# Patient Record
Sex: Male | Born: 1935 | Race: White | Hispanic: No | Marital: Married | Smoking: Former smoker
Health system: Southern US, Community
[De-identification: ages and names within clinical notes are randomized; demographics above are authoritative.]

## PROBLEM LIST (undated history)

## (undated) DIAGNOSIS — J90 Pleural effusion, not elsewhere classified: Secondary | ICD-10-CM

## (undated) DIAGNOSIS — N400 Enlarged prostate without lower urinary tract symptoms: Secondary | ICD-10-CM

## (undated) DIAGNOSIS — E7800 Pure hypercholesterolemia, unspecified: Secondary | ICD-10-CM

## (undated) DIAGNOSIS — I4891 Unspecified atrial fibrillation: Secondary | ICD-10-CM

## (undated) DIAGNOSIS — N189 Chronic kidney disease, unspecified: Secondary | ICD-10-CM

## (undated) DIAGNOSIS — I10 Essential (primary) hypertension: Secondary | ICD-10-CM

## (undated) DIAGNOSIS — I509 Heart failure, unspecified: Secondary | ICD-10-CM

## (undated) DIAGNOSIS — I219 Acute myocardial infarction, unspecified: Secondary | ICD-10-CM

## (undated) DIAGNOSIS — E119 Type 2 diabetes mellitus without complications: Secondary | ICD-10-CM

## (undated) DIAGNOSIS — Z972 Presence of dental prosthetic device (complete) (partial): Secondary | ICD-10-CM

## (undated) HISTORY — DX: Essential (primary) hypertension: I10

## (undated) HISTORY — DX: Heart failure, unspecified: I50.9

## (undated) HISTORY — PX: TOE SURGERY: SHX1073

---

## 1984-03-28 DIAGNOSIS — I219 Acute myocardial infarction, unspecified: Secondary | ICD-10-CM

## 1984-03-28 HISTORY — PX: CARDIAC CATHETERIZATION: SHX172

## 1984-03-28 HISTORY — DX: Acute myocardial infarction, unspecified: I21.9

## 2016-02-15 ENCOUNTER — Encounter: Payer: Self-pay | Admitting: *Deleted

## 2016-02-16 NOTE — Discharge Instructions (Signed)
Cataract Surgery, Care After °Refer to this sheet in the next few weeks. These instructions provide you with information about caring for yourself after your procedure. Your health care provider may also give you more specific instructions. Your treatment has been planned according to current medical practices, but problems sometimes occur. Call your health care provider if you have any problems or questions after your procedure. °What can I expect after the procedure? °After the procedure, it is common to have: °· Itching. °· Discomfort. °· Fluid discharge. °· Sensitivity to light and to touch. °· Bruising. °Follow these instructions at home: °Eye Care  °· Check your eye every day for signs of infection. Watch for: °¨ Redness, swelling, or pain. °¨ Fluid, blood, or pus. °¨ Warmth. °¨ Bad smell. °Activity  °· Avoid strenuous activities, such as playing contact sports, for as long as told by your health care provider. °· Do not drive or operate heavy machinery until your health care provider approves. °· Do not bend or lift heavy objects . Bending increases pressure in the eye. You can walk, climb stairs, and do light household chores. °· Ask your health care provider when you can return to work. If you work in a dusty environment, you may be advised to wear protective eyewear for a period of time. °General instructions  °· Take or apply over-the-counter and prescription medicines only as told by your health care provider. This includes eye drops. °· Do not touch or rub your eyes. °· If you were given a protective shield, wear it as told by your health care provider. If you were not given a protective shield, wear sunglasses as told by your health care provider to protect your eyes. °· Keep the area around your eye clean and dry. Avoid swimming or allowing water to hit you directly in the face while showering until told by your health care provider. Keep soap and shampoo out of your eyes. °· Do not put a contact lens  into the affected eye or eyes until your health care provider approves. °· Keep all follow-up visits as told by your health care provider. This is important. °Contact a health care provider if: ° °· You have increased bruising around your eye. °· You have pain that is not helped with medicine. °· You have a fever. °· You have redness, swelling, or pain in your eye. °· You have fluid, blood, or pus coming from your incision. °· Your vision gets worse. °Get help right away if: °· You have sudden vision loss. °This information is not intended to replace advice given to you by your health care provider. Make sure you discuss any questions you have with your health care provider. °Document Released: 10/01/2004 Document Revised: 07/23/2015 Document Reviewed: 01/22/2015 °Elsevier Interactive Patient Education © 2017 [employer] ° ° ° ° °General Anesthesia, Adult, Care After °These instructions provide you with information about caring for yourself after your procedure. Your health care provider may also give you more specific instructions. Your treatment has been planned according to current medical practices, but problems sometimes occur. Call your health care provider if you have any problems or questions after your procedure. °What can I expect after the procedure? °After the procedure, it is common to have: °· Vomiting. °· A sore throat. °· Mental slowness. °It is common to feel: °· Nauseous. °· Cold or shivery. °· Sleepy. °· Tired. °· Sore or achy, even in parts of your body where you did not have surgery. °Follow these instructions at   home: °For at least 24 hours after the procedure:  °· Do not: °¨ Participate in activities where you could fall or become injured. °¨ Drive. °¨ Use heavy machinery. °¨ Drink alcohol. °¨ Take sleeping pills or medicines that cause drowsiness. °¨ Make important decisions or sign legal documents. °¨ Take care of children on your own. °· Rest. °Eating and drinking  °· If you vomit, drink  water, juice, or soup when you can drink without vomiting. °· Drink enough fluid to keep your urine clear or pale yellow. °· Make sure you have little or no nausea before eating solid foods. °· Follow the diet recommended by your health care provider. °General instructions  °· Have a responsible adult stay with you until you are awake and alert. °· Return to your normal activities as told by your health care provider. Ask your health care provider what activities are safe for you. °· Take over-the-counter and prescription medicines only as told by your health care provider. °· If you smoke, do not smoke without supervision. °· Keep all follow-up visits as told by your health care provider. This is important. °Contact a health care provider if: °· You continue to have nausea or vomiting at home, and medicines are not helpful. °· You cannot drink fluids or start eating again. °· You cannot urinate after 8-12 hours. °· You develop a skin rash. °· You have fever. °· You have increasing redness at the site of your procedure. °Get help right away if: °· You have difficulty breathing. °· You have chest pain. °· You have unexpected bleeding. °· You feel that you are having a life-threatening or urgent problem. °This information is not intended to replace advice given to you by your health care provider. Make sure you discuss any questions you have with your health care provider. °Document Released: 06/20/2000 Document Revised: 08/17/2015 Document Reviewed: 02/26/2015 °Elsevier Interactive Patient Education © 2017 [employer] ° °

## 2016-02-23 ENCOUNTER — Encounter
Admission: RE | Disposition: A | Discharge status: Home / Self Care | Payer: Self-pay | Source: Ambulatory Visit | Attending: Ophthalmology

## 2016-02-23 ENCOUNTER — Ambulatory Visit: Payer: No Typology Code available for payment source | Admitting: Anesthesiology

## 2016-02-23 ENCOUNTER — Ambulatory Visit
Admission: RE | Admit: 2016-02-23 | Discharge: 2016-02-23 | Disposition: A | Discharge status: Home / Self Care | Payer: No Typology Code available for payment source | Source: Ambulatory Visit | Attending: Ophthalmology | Admitting: Ophthalmology

## 2016-02-23 DIAGNOSIS — E1136 Type 2 diabetes mellitus with diabetic cataract: Secondary | ICD-10-CM | POA: Insufficient documentation

## 2016-02-23 DIAGNOSIS — I252 Old myocardial infarction: Secondary | ICD-10-CM | POA: Insufficient documentation

## 2016-02-23 DIAGNOSIS — Z87891 Personal history of nicotine dependence: Secondary | ICD-10-CM | POA: Insufficient documentation

## 2016-02-23 DIAGNOSIS — I2510 Atherosclerotic heart disease of native coronary artery without angina pectoris: Secondary | ICD-10-CM | POA: Insufficient documentation

## 2016-02-23 HISTORY — DX: Acute myocardial infarction, unspecified: I21.9

## 2016-02-23 HISTORY — DX: Pure hypercholesterolemia, unspecified: E78.00

## 2016-02-23 HISTORY — DX: Presence of dental prosthetic device (complete) (partial): Z97.2

## 2016-02-23 HISTORY — DX: Type 2 diabetes mellitus without complications: E11.9

## 2016-02-23 HISTORY — DX: Chronic kidney disease, unspecified: N18.9

## 2016-02-23 HISTORY — PX: CATARACT EXTRACTION W/PHACO: SHX586

## 2016-02-23 LAB — GLUCOSE, CAPILLARY: GLUCOSE-CAPILLARY: 105 mg/dL — AB (ref 65–99)

## 2016-02-23 SURGERY — PHACOEMULSIFICATION, CATARACT, WITH IOL INSERTION
Anesthesia: Monitor Anesthesia Care | Site: Eye | Laterality: Right | Wound class: Clean

## 2016-02-23 MED ORDER — SODIUM HYALURONATE 23 MG/ML IO SOLN
INTRAOCULAR | Status: DC | PRN
  Administered 2016-02-23: 0.6 mL via INTRAOCULAR

## 2016-02-23 MED ORDER — FENTANYL CITRATE (PF) 100 MCG/2ML IJ SOLN
INTRAMUSCULAR | Status: DC | PRN
  Administered 2016-02-23: 50 ug via INTRAVENOUS

## 2016-02-23 MED ORDER — MOXIFLOXACIN HCL 0.5 % OP SOLN
OPHTHALMIC | Status: DC | PRN
  Administered 2016-02-23: 1 [drp] via OPHTHALMIC

## 2016-02-23 MED ORDER — ARMC OPHTHALMIC DILATING DROPS
1.0000 | OPHTHALMIC | Status: DC | PRN
Start: 2016-02-23 — End: 2016-02-23
  Administered 2016-02-23 (×3): 1 via OPHTHALMIC

## 2016-02-23 MED ORDER — SODIUM HYALURONATE 10 MG/ML IO SOLN
INTRAOCULAR | Status: DC | PRN
  Administered 2016-02-23: 0.85 mL via INTRAOCULAR

## 2016-02-23 MED ORDER — EPINEPHRINE PF 1 MG/ML IJ SOLN
INTRAOCULAR | Status: DC | PRN
  Administered 2016-02-23: 102 mL via OPHTHALMIC

## 2016-02-23 MED ORDER — MIDAZOLAM HCL 2 MG/2ML IJ SOLN
INTRAMUSCULAR | Status: DC | PRN
  Administered 2016-02-23: 2 mg via INTRAVENOUS

## 2016-02-23 MED ORDER — BSS IO SOLN
INTRAOCULAR | Status: DC | PRN
  Administered 2016-02-23: 1 mL via OPHTHALMIC

## 2016-02-23 SURGICAL SUPPLY — 19 items
CANNULA ANT/CHMB 27GA (MISCELLANEOUS) ×3 IMPLANT
CUP MEDICINE 2OZ PLAST GRAD ST (MISCELLANEOUS) ×3 IMPLANT
DISSECTOR HYDRO NUCLEUS 50X22 (MISCELLANEOUS) ×3 IMPLANT
GLOVE BIO SURGEON STRL SZ8 (GLOVE) ×3 IMPLANT
GLOVE SURG LX 7.5 STRW (GLOVE) ×4
GLOVE SURG LX STRL 7.5 STRW (GLOVE) ×2 IMPLANT
GOWN STRL REUS W/ TWL LRG LVL3 (GOWN DISPOSABLE) ×2 IMPLANT
GOWN STRL REUS W/TWL LRG LVL3 (GOWN DISPOSABLE) ×4
LENS IOL TECNIS ITEC 19.0 (Intraocular Lens) ×3 IMPLANT
MARKER SKIN DUAL TIP RULER LAB (MISCELLANEOUS) ×3 IMPLANT
PACK CATARACT (MISCELLANEOUS) ×3 IMPLANT
PACK CATARACT BRASINGTON (MISCELLANEOUS) ×3 IMPLANT
PACK EYE AFTER SURG (MISCELLANEOUS) ×3 IMPLANT
SOL PREP PVP 2OZ (MISCELLANEOUS) ×3
SOLUTION PREP PVP 2OZ (MISCELLANEOUS) ×1 IMPLANT
SYR 3ML LL SCALE MARK (SYRINGE) ×3 IMPLANT
SYR TB 1ML LUER SLIP (SYRINGE) ×3 IMPLANT
WATER STERILE IRR 250ML POUR (IV SOLUTION) ×3 IMPLANT
WIPE NON LINTING 3.25X3.25 (MISCELLANEOUS) ×3 IMPLANT

## 2016-02-23 NOTE — H&P (Signed)
The History and Physical notes are on paper, have been signed, and are to be scanned. The patient remains stable and unchanged from the H&P.   Previous H&P reviewed, patient examined, and there are no changes.  Willey BladeBradley King 02/23/2016 9:04 AM

## 2016-02-23 NOTE — Anesthesia Postprocedure Evaluation (Signed)
Anesthesia Post Note  Patient: Andrew Watts  Procedure(s) Performed: Procedure(s) (LRB): CATARACT EXTRACTION PHACO AND INTRAOCULAR LENS PLACEMENT (IOC) (Right)  Patient location during evaluation: PACU Anesthesia Type: MAC Level of consciousness: awake and alert Pain management: pain level controlled Vital Signs Assessment: post-procedure vital signs reviewed and stable Respiratory status: spontaneous breathing, nonlabored ventilation, respiratory function stable and patient connected to nasal cannula oxygen Cardiovascular status: stable and blood pressure returned to baseline Anesthetic complications: no    Scarlette Sliceachel B Deangelo Berns

## 2016-02-23 NOTE — Op Note (Signed)
OPERATIVE NOTE  Andrew Watts [id number] 02/23/2016   PREOPERATIVE DIAGNOSIS:  Nuclear sclerotic cataract right eye.  H25.11   POSTOPERATIVE DIAGNOSIS:    Nuclear sclerotic cataract right eye.   2.  Intraoperative floppy iris syndrome.   PROCEDURE:  Phacoemusification with posterior chamber intraocular lens placement of the right eye   LENS:   Implant Name Type Inv. Item Serial No. Manufacturer Lot No. LRB No. Used  LENS IOL DIOP 19.0 - [id number] Intraocular Lens LENS IOL DIOP 19.0 [id number]   Right 1       PCB00 +19.0   ULTRASOUND TIME: 0 minutes 42 seconds.  CDE 5.56   SURGEON:  Willey BladeBradley King, MD, MPH  ANESTHESIOLOGIST: Anesthesiologist: Scarlette Sliceachel B Beach, MD CRNA: Andee PolesWendy Bush, CRNA   ANESTHESIA:  Topical with tetracaine drops augmented with 1% preservative-free intracameral lidocaine.  ESTIMATED BLOOD LOSS: less than 1 mL.   COMPLICATIONS:  None.   DESCRIPTION OF PROCEDURE:  The patient was identified in the holding room and transported to the operating room and placed in the supine position under the operating microscope.  The right eye was identified as the operative eye and it was prepped and draped in the usual sterile ophthalmic fashion.   A 1.0 millimeter clear-corneal paracentesis was made at the 10:30 position. 0.5 ml of preservative-free 1% lidocaine with epinephrine was injected into the anterior chamber.  The anterior chamber was filled with Healon 5 viscoelastic.  A 2.4 millimeter keratome was used to make a near-clear corneal incision at the 8:00 position.  A curvilinear capsulorrhexis was made with a cystotome and capsulorrhexis forceps.  Balanced salt solution was used to hydrodissect and hydrodelineate the nucleus.   Phacoemulsification was then used in stop and chop fashion to remove the lens nucleus and epinucleus.  The remaining cortex was then removed using the irrigation and aspiration handpiece. Healon was then placed into the capsular bag to distend  it for lens placement.  A lens was then injected into the capsular bag.  The remaining viscoelastic was aspirated.   Wounds were hydrated with balanced salt solution.  The anterior chamber was inflated to a physiologic pressure with balanced salt solution.   Intracameral vigamox 0.1 mL undiluted was injected into the eye and a drop placed onto the ocular surface.  No wound leaks were noted.  The patient was taken to the recovery room in stable condition without complications of anesthesia or surgery  The iris became mildly floppy and miotic during the surgery, and the patient had a prominent bells and prominent brow, but there were no complications and the case was completed smoothly without complication.  Willey BladeBradley King 02/23/2016, 10:38 AM

## 2016-02-23 NOTE — Anesthesia Preprocedure Evaluation (Signed)
Anesthesia Evaluation  Patient identified by MRN, date of birth, ID band Patient awake    Reviewed: Allergy & Precautions, H&P , NPO status , Patient's Chart, lab work & pertinent test results, reviewed documented beta blocker date and time   Airway Mallampati: II  TM Distance: >3 FB Neck ROM: full    Dental  (+) Upper Dentures, Lower Dentures   Pulmonary neg pulmonary ROS, former smoker,    Pulmonary exam normal breath sounds clear to auscultation       Cardiovascular Exercise Tolerance: Good + CAD and + Past MI   Rhythm:regular Rate:Normal     Neuro/Psych negative neurological ROS  negative psych ROS   GI/Hepatic negative GI ROS, Neg liver ROS,   Endo/Other  diabetes  Renal/GU CRFRenal disease  negative genitourinary   Musculoskeletal   Abdominal   Peds  Hematology negative hematology ROS (+)   Anesthesia Other Findings   Reproductive/Obstetrics negative OB ROS                             Anesthesia Physical Anesthesia Plan  ASA: III  Anesthesia Plan: MAC   Post-op Pain Management:    Induction:   Airway Management Planned:   Additional Equipment:   Intra-op Plan:   Post-operative Plan:   Informed Consent: I have reviewed the patients History and Physical, chart, labs and discussed the procedure including the risks, benefits and alternatives for the proposed anesthesia with the patient or authorized representative who has indicated his/her understanding and acceptance.   Dental Advisory Given  Plan Discussed with: CRNA  Anesthesia Plan Comments:         Anesthesia Quick Evaluation

## 2016-02-23 NOTE — Anesthesia Procedure Notes (Signed)
Procedure Name: MAC Performed by: Sundee Garland Pre-anesthesia Checklist: Patient identified, Emergency Drugs available, Suction available, Timeout performed and Patient being monitored Patient Re-evaluated:Patient Re-evaluated prior to inductionOxygen Delivery Method: Nasal cannula Placement Confirmation: positive ETCO2     

## 2016-02-23 NOTE — Transfer of Care (Signed)
Immediate Anesthesia Transfer of Care Note  Patient: Andrew Watts  Procedure(s) Performed: Procedure(s) with comments: CATARACT EXTRACTION PHACO AND INTRAOCULAR LENS PLACEMENT (IOC) (Right) - RIGHT DIABETIC - oral meds  Patient Location: PACU  Anesthesia Type: MAC  Level of Consciousness: awake, alert  and patient cooperative  Airway and Oxygen Therapy: Patient Spontanous Breathing and Patient connected to supplemental oxygen  Post-op Assessment: Post-op Vital signs reviewed, Patient's Cardiovascular Status Stable, Respiratory Function Stable, Patent Airway and No signs of Nausea or vomiting  Post-op Vital Signs: Reviewed and stable  Complications: No apparent anesthesia complications

## 2016-02-24 ENCOUNTER — Encounter: Payer: Self-pay | Admitting: Ophthalmology

## 2016-03-01 ENCOUNTER — Encounter: Payer: Self-pay | Admitting: Ophthalmology

## 2016-03-29 ENCOUNTER — Encounter: Payer: Self-pay | Admitting: *Deleted

## 2016-03-29 NOTE — Discharge Instructions (Signed)
Cataract Surgery, Care After °Refer to this sheet in the next few weeks. These instructions provide you with information about caring for yourself after your procedure. Your health care provider may also give you more specific instructions. Your treatment has been planned according to current medical practices, but problems sometimes occur. Call your health care provider if you have any problems or questions after your procedure. °What can I expect after the procedure? °After the procedure, it is common to have: °· Itching. °· Discomfort. °· Fluid discharge. °· Sensitivity to light and to touch. °· Bruising. °Follow these instructions at home: °Eye Care  °· Check your eye every day for signs of infection. Watch for: °¨ Redness, swelling, or pain. °¨ Fluid, blood, or pus. °¨ Warmth. °¨ Bad smell. °Activity  °· Avoid strenuous activities, such as playing contact sports, for as long as told by your health care provider. °· Do not drive or operate heavy machinery until your health care provider approves. °· Do not bend or lift heavy objects . Bending increases pressure in the eye. You can walk, climb stairs, and do light household chores. °· Ask your health care provider when you can return to work. If you work in a dusty environment, you may be advised to wear protective eyewear for a period of time. °General instructions  °· Take or apply over-the-counter and prescription medicines only as told by your health care provider. This includes eye drops. °· Do not touch or rub your eyes. °· If you were given a protective shield, wear it as told by your health care provider. If you were not given a protective shield, wear sunglasses as told by your health care provider to protect your eyes. °· Keep the area around your eye clean and dry. Avoid swimming or allowing water to hit you directly in the face while showering until told by your health care provider. Keep soap and shampoo out of your eyes. °· Do not put a contact lens  into the affected eye or eyes until your health care provider approves. °· Keep all follow-up visits as told by your health care provider. This is important. °Contact a health care provider if: ° °· You have increased bruising around your eye. °· You have pain that is not helped with medicine. °· You have a fever. °· You have redness, swelling, or pain in your eye. °· You have fluid, blood, or pus coming from your incision. °· Your vision gets worse. °Get help right away if: °· You have sudden vision loss. °This information is not intended to replace advice given to you by your health care provider. Make sure you discuss any questions you have with your health care provider. °Document Released: 10/01/2004 Document Revised: 07/23/2015 Document Reviewed: 01/22/2015 °Elsevier Interactive Patient Education © 2017 [employer] ° ° ° ° °General Anesthesia, Adult, Care After °These instructions provide you with information about caring for yourself after your procedure. Your health care provider may also give you more specific instructions. Your treatment has been planned according to current medical practices, but problems sometimes occur. Call your health care provider if you have any problems or questions after your procedure. °What can I expect after the procedure? °After the procedure, it is common to have: °· Vomiting. °· A sore throat. °· Mental slowness. °It is common to feel: °· Nauseous. °· Cold or shivery. °· Sleepy. °· Tired. °· Sore or achy, even in parts of your body where you did not have surgery. °Follow these instructions at   home: °For at least 24 hours after the procedure:  °· Do not: °¨ Participate in activities where you could fall or become injured. °¨ Drive. °¨ Use heavy machinery. °¨ Drink alcohol. °¨ Take sleeping pills or medicines that cause drowsiness. °¨ Make important decisions or sign legal documents. °¨ Take care of children on your own. °· Rest. °Eating and drinking  °· If you vomit, drink  water, juice, or soup when you can drink without vomiting. °· Drink enough fluid to keep your urine clear or pale yellow. °· Make sure you have little or no nausea before eating solid foods. °· Follow the diet recommended by your health care provider. °General instructions  °· Have a responsible adult stay with you until you are awake and alert. °· Return to your normal activities as told by your health care provider. Ask your health care provider what activities are safe for you. °· Take over-the-counter and prescription medicines only as told by your health care provider. °· If you smoke, do not smoke without supervision. °· Keep all follow-up visits as told by your health care provider. This is important. °Contact a health care provider if: °· You continue to have nausea or vomiting at home, and medicines are not helpful. °· You cannot drink fluids or start eating again. °· You cannot urinate after 8-12 hours. °· You develop a skin rash. °· You have fever. °· You have increasing redness at the site of your procedure. °Get help right away if: °· You have difficulty breathing. °· You have chest pain. °· You have unexpected bleeding. °· You feel that you are having a life-threatening or urgent problem. °This information is not intended to replace advice given to you by your health care provider. Make sure you discuss any questions you have with your health care provider. °Document Released: 06/20/2000 Document Revised: 08/17/2015 Document Reviewed: 02/26/2015 °Elsevier Interactive Patient Education © 2017 [employer] ° °

## 2016-04-05 ENCOUNTER — Ambulatory Visit: Payer: Medicare Other | Admitting: Anesthesiology

## 2016-04-05 ENCOUNTER — Encounter
Admission: RE | Disposition: A | Discharge status: Home / Self Care | Payer: Self-pay | Source: Ambulatory Visit | Attending: Ophthalmology

## 2016-04-05 ENCOUNTER — Ambulatory Visit
Admission: RE | Admit: 2016-04-05 | Discharge: 2016-04-05 | Disposition: A | Discharge status: Home / Self Care | Payer: Medicare Other | Source: Ambulatory Visit | Attending: Ophthalmology | Admitting: Ophthalmology

## 2016-04-05 DIAGNOSIS — Z79899 Other long term (current) drug therapy: Secondary | ICD-10-CM | POA: Diagnosis not present

## 2016-04-05 DIAGNOSIS — Z7982 Long term (current) use of aspirin: Secondary | ICD-10-CM | POA: Diagnosis not present

## 2016-04-05 DIAGNOSIS — Z87891 Personal history of nicotine dependence: Secondary | ICD-10-CM | POA: Insufficient documentation

## 2016-04-05 DIAGNOSIS — Z7984 Long term (current) use of oral hypoglycemic drugs: Secondary | ICD-10-CM | POA: Diagnosis not present

## 2016-04-05 DIAGNOSIS — N189 Chronic kidney disease, unspecified: Secondary | ICD-10-CM | POA: Insufficient documentation

## 2016-04-05 DIAGNOSIS — I252 Old myocardial infarction: Secondary | ICD-10-CM | POA: Diagnosis not present

## 2016-04-05 DIAGNOSIS — Z955 Presence of coronary angioplasty implant and graft: Secondary | ICD-10-CM | POA: Diagnosis not present

## 2016-04-05 DIAGNOSIS — E7800 Pure hypercholesterolemia, unspecified: Secondary | ICD-10-CM | POA: Diagnosis not present

## 2016-04-05 DIAGNOSIS — E1122 Type 2 diabetes mellitus with diabetic chronic kidney disease: Secondary | ICD-10-CM | POA: Diagnosis not present

## 2016-04-05 DIAGNOSIS — H2512 Age-related nuclear cataract, left eye: Secondary | ICD-10-CM | POA: Diagnosis present

## 2016-04-05 DIAGNOSIS — I129 Hypertensive chronic kidney disease with stage 1 through stage 4 chronic kidney disease, or unspecified chronic kidney disease: Secondary | ICD-10-CM | POA: Insufficient documentation

## 2016-04-05 HISTORY — PX: CATARACT EXTRACTION W/PHACO: SHX586

## 2016-04-05 LAB — GLUCOSE, CAPILLARY
GLUCOSE-CAPILLARY: 109 mg/dL — AB (ref 65–99)
Glucose-Capillary: 105 mg/dL — ABNORMAL HIGH (ref 65–99)

## 2016-04-05 SURGERY — PHACOEMULSIFICATION, CATARACT, WITH IOL INSERTION
Anesthesia: Monitor Anesthesia Care | Laterality: Left | Wound class: Clean

## 2016-04-05 MED ORDER — FENTANYL CITRATE (PF) 100 MCG/2ML IJ SOLN
INTRAMUSCULAR | Status: DC | PRN
  Administered 2016-04-05: 100 ug via INTRAVENOUS

## 2016-04-05 MED ORDER — ACETAMINOPHEN 160 MG/5ML PO SOLN: 325.0000 mg | ORAL | Status: DC | PRN

## 2016-04-05 MED ORDER — MIDAZOLAM HCL 2 MG/2ML IJ SOLN
INTRAMUSCULAR | Status: DC | PRN
  Administered 2016-04-05: 2 mg via INTRAVENOUS

## 2016-04-05 MED ORDER — ACETAMINOPHEN 325 MG PO TABS: 325.0000 mg | ORAL_TABLET | ORAL | Status: DC | PRN

## 2016-04-05 MED ORDER — SODIUM HYALURONATE 10 MG/ML IO SOLN
INTRAOCULAR | Status: DC | PRN
  Administered 2016-04-05: .55 mL via INTRAOCULAR

## 2016-04-05 MED ORDER — ARMC OPHTHALMIC DILATING DROPS
1.0000 "application " | OPHTHALMIC | Status: DC | PRN
  Administered 2016-04-05 (×3): 1 via OPHTHALMIC

## 2016-04-05 MED ORDER — LACTATED RINGERS IV SOLN: 500.0000 mL | INTRAVENOUS | Status: DC

## 2016-04-05 MED ORDER — SODIUM HYALURONATE 23 MG/ML IO SOLN
INTRAOCULAR | Status: DC | PRN
  Administered 2016-04-05: 0.6 mL via INTRAOCULAR

## 2016-04-05 MED ORDER — MOXIFLOXACIN HCL 0.5 % OP SOLN
OPHTHALMIC | Status: DC | PRN
  Administered 2016-04-05: 15 [drp] via OPHTHALMIC

## 2016-04-05 MED ORDER — LIDOCAINE HCL (PF) 4 % IJ SOLN
INTRAMUSCULAR | Status: DC | PRN
  Administered 2016-04-05: 1 mL via OPHTHALMIC

## 2016-04-05 MED ORDER — LACTATED RINGERS IV SOLN: INTRAVENOUS | Status: DC

## 2016-04-05 SURGICAL SUPPLY — 19 items
CANNULA ANT/CHMB 27GA (MISCELLANEOUS) ×3 IMPLANT
CUP MEDICINE 2OZ PLAST GRAD ST (MISCELLANEOUS) ×3 IMPLANT
DISSECTOR HYDRO NUCLEUS 50X22 (MISCELLANEOUS) ×3 IMPLANT
GLOVE BIO SURGEON STRL SZ8 (GLOVE) ×3 IMPLANT
GLOVE SURG LX 7.5 STRW (GLOVE) ×2
GLOVE SURG LX STRL 7.5 STRW (GLOVE) ×1 IMPLANT
GOWN STRL REUS W/ TWL LRG LVL3 (GOWN DISPOSABLE) ×2 IMPLANT
GOWN STRL REUS W/TWL LRG LVL3 (GOWN DISPOSABLE) ×4
LENS IOL TECNIS ITEC 20.5 (Intraocular Lens) ×3 IMPLANT
MARKER SKIN DUAL TIP RULER LAB (MISCELLANEOUS) ×3 IMPLANT
PACK CATARACT (MISCELLANEOUS) ×3 IMPLANT
PACK CATARACT BRASINGTON (MISCELLANEOUS) ×3 IMPLANT
PACK EYE AFTER SURG (MISCELLANEOUS) ×3 IMPLANT
SOL PREP PVP 2OZ (MISCELLANEOUS) ×3
SOLUTION PREP PVP 2OZ (MISCELLANEOUS) ×1 IMPLANT
SYR 3ML LL SCALE MARK (SYRINGE) ×3 IMPLANT
SYR TB 1ML LUER SLIP (SYRINGE) ×3 IMPLANT
WATER STERILE IRR 250ML POUR (IV SOLUTION) ×3 IMPLANT
WIPE NON LINTING 3.25X3.25 (MISCELLANEOUS) ×3 IMPLANT

## 2016-04-05 NOTE — Transfer of Care (Signed)
Immediate Anesthesia Transfer of Care Note  Patient: Andrew Watts  Procedure(s) Performed: Procedure(s) with comments: CATARACT EXTRACTION PHACO AND INTRAOCULAR LENS PLACEMENT (IOC) (Left) - LEFT DIABETES - oral meds IVA TOPICAL  Patient Location: PACU  Anesthesia Type: MAC  Level of Consciousness: awake, alert  and patient cooperative  Airway and Oxygen Therapy: Patient Spontanous Breathing and Patient connected to supplemental oxygen  Post-op Assessment: Post-op Vital signs reviewed, Patient's Cardiovascular Status Stable, Respiratory Function Stable, Patent Airway and No signs of Nausea or vomiting  Post-op Vital Signs: Reviewed and stable  Complications: No apparent anesthesia complications

## 2016-04-05 NOTE — H&P (Signed)
The History and Physical notes are on paper, have been signed, and are to be scanned. The patient remains stable and unchanged from the H&P.   Previous H&P reviewed, patient examined, and there are no changes.  Willey BladeBradley Rhen Kawecki 04/05/2016 9:23 AM

## 2016-04-05 NOTE — Op Note (Signed)
OPERATIVE NOTE  Nadene Rubinsrchie Gassmann [id number] 04/05/2016   PREOPERATIVE DIAGNOSIS:  Nuclear sclerotic cataract left eye.  H25.12   POSTOPERATIVE DIAGNOSIS:    Nuclear sclerotic cataract left eye.     PROCEDURE:  Phacoemusification with posterior chamber intraocular lens placement of the left eye   LENS:   Implant Name Type Inv. Item Serial No. Manufacturer Lot No. LRB No. Used  LENS IOL DIOP 20.5 - [id number] Intraocular Lens LENS IOL DIOP 20.5 [id number]   Left 1       PCB00+20.5   ULTRASOUND TIME: 0 minutes 55 seconds.  CDE 11.00   SURGEON:  Willey BladeBradley King, MD, MPH   ANESTHESIA:  Topical with tetracaine drops augmented with 1% preservative-free intracameral lidocaine.   COMPLICATIONS:  None.   DESCRIPTION OF PROCEDURE:  The patient was identified in the holding room and transported to the operating room and placed in the supine position under the operating microscope.  The left eye was identified as the operative eye and it was prepped and draped in the usual sterile ophthalmic fashion.   A 1.0 millimeter clear-corneal paracentesis was made at the 5:00 position. 0.5 ml of preservative-free 1% lidocaine with epinephrine was injected into the anterior chamber.  The anterior chamber was filled with Healon 5 viscoelastic.  A 2.4 millimeter keratome was used to make a near-clear corneal incision at the 2:00 position.  A curvilinear capsulorrhexis was made with a cystotome and capsulorrhexis forceps.  Balanced salt solution was used to hydrodissect and hydrodelineate the nucleus.  The lens prolapsed forward during the hydrodissection and an anterior capsular tear was noted at 12:00.   Phacoemulsification was then used in stop and chop fashion to remove the lens nucleus and epinucleus.  The remaining cortex was then removed using the irrigation and aspiration handpiece. Healon was then placed into the capsular bag to distend it for lens placement.  A lens was then injected into the capsular  bag.  The haptics were oriented at 9:00 and 3:00--away from the anterior capsular tear.  The remaining viscoelastic was aspirated.  The lens was well centered and the posterior capsule was intact.   Wounds were hydrated with balanced salt solution.  The anterior chamber was inflated to a physiologic pressure with balanced salt solution.  Intracameral vigamox 0.1 mL undiltued was injected into the eye and a drop placed onto the ocular surface.  No wound leaks were noted.  The patient was taken to the recovery room in stable condition without complications of anesthesia or surgery  Willey BladeBradley King 04/05/2016, 10:03 AM

## 2016-04-05 NOTE — Anesthesia Procedure Notes (Signed)
Procedure Name: MAC Date/Time: 04/05/2016 9:35 AM Performed by: Janna Arch Pre-anesthesia Checklist: Patient identified, Emergency Drugs available, Suction available and Patient being monitored Patient Re-evaluated:Patient Re-evaluated prior to inductionOxygen Delivery Method: Nasal cannula

## 2016-04-05 NOTE — Anesthesia Preprocedure Evaluation (Signed)
Anesthesia Evaluation  Patient identified by MRN, date of birth, ID band Patient awake    Reviewed: Allergy & Precautions, H&P , NPO status , Patient's Chart, lab work & pertinent test results, reviewed documented beta blocker date and time   Airway Mallampati: II  TM Distance: >3 FB Neck ROM: full    Dental no notable dental hx.    Pulmonary former smoker,    Pulmonary exam normal breath sounds clear to auscultation       Cardiovascular Exercise Tolerance: Good + Past MI   Rhythm:regular Rate:Normal     Neuro/Psych negative neurological ROS  negative psych ROS   GI/Hepatic negative GI ROS, Neg liver ROS,   Endo/Other  diabetes, Type 2  Renal/GU CRF  negative genitourinary   Musculoskeletal   Abdominal   Peds  Hematology negative hematology ROS (+)   Anesthesia Other Findings   Reproductive/Obstetrics negative OB ROS                             Anesthesia Physical Anesthesia Plan  ASA: III  Anesthesia Plan: MAC   Post-op Pain Management:    Induction:   Airway Management Planned:   Additional Equipment:   Intra-op Plan:   Post-operative Plan:   Informed Consent: I have reviewed the patients History and Physical, chart, labs and discussed the procedure including the risks, benefits and alternatives for the proposed anesthesia with the patient or authorized representative who has indicated his/her understanding and acceptance.     Plan Discussed with: CRNA  Anesthesia Plan Comments:         Anesthesia Quick Evaluation

## 2016-04-05 NOTE — Anesthesia Postprocedure Evaluation (Signed)
Anesthesia Post Note  Patient: Andrew Watts  Procedure(s) Performed: Procedure(s) (LRB): CATARACT EXTRACTION PHACO AND INTRAOCULAR LENS PLACEMENT (IOC) (Left)  Patient location during evaluation: PACU Anesthesia Type: MAC Level of consciousness: awake and alert Pain management: pain level controlled Vital Signs Assessment: post-procedure vital signs reviewed and stable Respiratory status: spontaneous breathing, nonlabored ventilation and respiratory function stable Cardiovascular status: stable and blood pressure returned to baseline Anesthetic complications: no    Bess Saltzman D Alston Berrie

## 2016-04-06 ENCOUNTER — Encounter: Payer: Self-pay | Admitting: Ophthalmology

## 2019-05-14 ENCOUNTER — Emergency Department: Payer: Medicare Other

## 2019-05-14 ENCOUNTER — Inpatient Hospital Stay
Admission: EM | Admit: 2019-05-14 | Discharge: 2019-05-17 | DRG: 291 | Disposition: A | Discharge status: Home / Self Care | Payer: Medicare Other | Attending: Internal Medicine | Admitting: Internal Medicine

## 2019-05-14 ENCOUNTER — Encounter: Payer: Self-pay | Admitting: Emergency Medicine

## 2019-05-14 ENCOUNTER — Other Ambulatory Visit: Payer: Self-pay

## 2019-05-14 DIAGNOSIS — Z87891 Personal history of nicotine dependence: Secondary | ICD-10-CM

## 2019-05-14 DIAGNOSIS — Z79899 Other long term (current) drug therapy: Secondary | ICD-10-CM

## 2019-05-14 DIAGNOSIS — J90 Pleural effusion, not elsewhere classified: Secondary | ICD-10-CM | POA: Diagnosis not present

## 2019-05-14 DIAGNOSIS — N1832 Chronic kidney disease, stage 3b: Secondary | ICD-10-CM

## 2019-05-14 DIAGNOSIS — I5031 Acute diastolic (congestive) heart failure: Secondary | ICD-10-CM

## 2019-05-14 DIAGNOSIS — I252 Old myocardial infarction: Secondary | ICD-10-CM | POA: Diagnosis not present

## 2019-05-14 DIAGNOSIS — Z7982 Long term (current) use of aspirin: Secondary | ICD-10-CM | POA: Diagnosis not present

## 2019-05-14 DIAGNOSIS — Z9889 Other specified postprocedural states: Secondary | ICD-10-CM

## 2019-05-14 DIAGNOSIS — Z833 Family history of diabetes mellitus: Secondary | ICD-10-CM

## 2019-05-14 DIAGNOSIS — E119 Type 2 diabetes mellitus without complications: Secondary | ICD-10-CM

## 2019-05-14 DIAGNOSIS — E7800 Pure hypercholesterolemia, unspecified: Secondary | ICD-10-CM | POA: Diagnosis present

## 2019-05-14 DIAGNOSIS — E1169 Type 2 diabetes mellitus with other specified complication: Secondary | ICD-10-CM

## 2019-05-14 DIAGNOSIS — I509 Heart failure, unspecified: Secondary | ICD-10-CM

## 2019-05-14 DIAGNOSIS — D696 Thrombocytopenia, unspecified: Secondary | ICD-10-CM | POA: Diagnosis present

## 2019-05-14 DIAGNOSIS — I130 Hypertensive heart and chronic kidney disease with heart failure and stage 1 through stage 4 chronic kidney disease, or unspecified chronic kidney disease: Principal | ICD-10-CM | POA: Diagnosis present

## 2019-05-14 DIAGNOSIS — I429 Cardiomyopathy, unspecified: Secondary | ICD-10-CM | POA: Diagnosis present

## 2019-05-14 DIAGNOSIS — I10 Essential (primary) hypertension: Secondary | ICD-10-CM

## 2019-05-14 DIAGNOSIS — I5021 Acute systolic (congestive) heart failure: Secondary | ICD-10-CM

## 2019-05-14 DIAGNOSIS — J918 Pleural effusion in other conditions classified elsewhere: Secondary | ICD-10-CM | POA: Diagnosis present

## 2019-05-14 DIAGNOSIS — I959 Hypotension, unspecified: Secondary | ICD-10-CM | POA: Diagnosis not present

## 2019-05-14 DIAGNOSIS — Z20822 Contact with and (suspected) exposure to covid-19: Secondary | ICD-10-CM | POA: Diagnosis present

## 2019-05-14 DIAGNOSIS — I4891 Unspecified atrial fibrillation: Secondary | ICD-10-CM | POA: Diagnosis present

## 2019-05-14 DIAGNOSIS — E1122 Type 2 diabetes mellitus with diabetic chronic kidney disease: Secondary | ICD-10-CM

## 2019-05-14 DIAGNOSIS — R0602 Shortness of breath: Secondary | ICD-10-CM

## 2019-05-14 DIAGNOSIS — N400 Enlarged prostate without lower urinary tract symptoms: Secondary | ICD-10-CM

## 2019-05-14 DIAGNOSIS — E785 Hyperlipidemia, unspecified: Secondary | ICD-10-CM | POA: Diagnosis present

## 2019-05-14 DIAGNOSIS — N183 Chronic kidney disease, stage 3 unspecified: Secondary | ICD-10-CM

## 2019-05-14 DIAGNOSIS — I2510 Atherosclerotic heart disease of native coronary artery without angina pectoris: Secondary | ICD-10-CM | POA: Diagnosis present

## 2019-05-14 LAB — CBC WITH DIFFERENTIAL/PLATELET
Abs Immature Granulocytes: 0.04 10*3/uL (ref 0.00–0.07)
Basophils Absolute: 0.1 10*3/uL (ref 0.0–0.1)
Basophils Relative: 1 %
Eosinophils Absolute: 0.1 10*3/uL (ref 0.0–0.5)
Eosinophils Relative: 1 %
HCT: 35.6 % — ABNORMAL LOW (ref 39.0–52.0)
Hemoglobin: 11.7 g/dL — ABNORMAL LOW (ref 13.0–17.0)
Immature Granulocytes: 0 %
Lymphocytes Relative: 14 %
Lymphs Abs: 1.3 10*3/uL (ref 0.7–4.0)
MCH: 30.2 pg (ref 26.0–34.0)
MCHC: 32.9 g/dL (ref 30.0–36.0)
MCV: 91.8 fL (ref 80.0–100.0)
Monocytes Absolute: 0.9 10*3/uL (ref 0.1–1.0)
Monocytes Relative: 9 %
Neutro Abs: 6.7 10*3/uL (ref 1.7–7.7)
Neutrophils Relative %: 75 %
Platelets: 122 10*3/uL — ABNORMAL LOW (ref 150–400)
RBC: 3.88 MIL/uL — ABNORMAL LOW (ref 4.22–5.81)
RDW: 12.4 % (ref 11.5–15.5)
WBC: 9 10*3/uL (ref 4.0–10.5)
nRBC: 0 % (ref 0.0–0.2)

## 2019-05-14 LAB — COMPREHENSIVE METABOLIC PANEL
ALT: 16 U/L (ref 0–44)
AST: 13 U/L — ABNORMAL LOW (ref 15–41)
Albumin: 3.8 g/dL (ref 3.5–5.0)
Alkaline Phosphatase: 69 U/L (ref 38–126)
Anion gap: 9 (ref 5–15)
BUN: 48 mg/dL — ABNORMAL HIGH (ref 8–23)
CO2: 20 mmol/L — ABNORMAL LOW (ref 22–32)
Calcium: 9.2 mg/dL (ref 8.9–10.3)
Chloride: 107 mmol/L (ref 98–111)
Creatinine, Ser: 1.78 mg/dL — ABNORMAL HIGH (ref 0.61–1.24)
GFR calc Af Amer: 40 mL/min — ABNORMAL LOW (ref >60)
GFR calc non Af Amer: 35 mL/min — ABNORMAL LOW (ref >60)
Glucose, Bld: 288 mg/dL — ABNORMAL HIGH (ref 70–99)
Potassium: 4.2 mmol/L (ref 3.5–5.1)
Sodium: 136 mmol/L (ref 135–145)
Total Bilirubin: 0.8 mg/dL (ref 0.3–1.2)
Total Protein: 7.2 g/dL (ref 6.5–8.1)

## 2019-05-14 LAB — GLUCOSE, CAPILLARY: Glucose-Capillary: 219 mg/dL — ABNORMAL HIGH (ref 70–99)

## 2019-05-14 LAB — TROPONIN I (HIGH SENSITIVITY)
Troponin I (High Sensitivity): 27 ng/L — ABNORMAL HIGH (ref <18)
Troponin I (High Sensitivity): 22 ng/L — ABNORMAL HIGH (ref <18)

## 2019-05-14 LAB — POC SARS CORONAVIRUS 2 AG: SARS Coronavirus 2 Ag: NEGATIVE

## 2019-05-14 LAB — TSH: TSH: 0.636 u[IU]/mL (ref 0.350–4.500)

## 2019-05-14 LAB — BRAIN NATRIURETIC PEPTIDE: B Natriuretic Peptide: 1017 pg/mL — ABNORMAL HIGH (ref 0.0–100.0)

## 2019-05-14 LAB — MAGNESIUM: Magnesium: 1.9 mg/dL (ref 1.7–2.4)

## 2019-05-14 MED ORDER — ZOLPIDEM TARTRATE 5 MG PO TABS: 5.0000 mg | ORAL_TABLET | Freq: Every evening | ORAL | Status: DC | PRN

## 2019-05-14 MED ORDER — ACETAMINOPHEN 325 MG PO TABS: 650.0000 mg | ORAL_TABLET | ORAL | Status: DC | PRN

## 2019-05-14 MED ORDER — ONDANSETRON HCL 4 MG/2ML IJ SOLN
4.0000 mg | Freq: Four times a day (QID) | INTRAMUSCULAR | Status: DC | PRN
  Administered 2019-05-15: 4 mg via INTRAVENOUS
  Filled 2019-05-14: qty 2

## 2019-05-14 MED ORDER — SIMVASTATIN 20 MG PO TABS
20.0000 mg | ORAL_TABLET | Freq: Every day | ORAL | Status: DC
  Administered 2019-05-14 – 2019-05-17 (×4): 20 mg via ORAL
  Filled 2019-05-14 (×4): qty 1

## 2019-05-14 MED ORDER — INSULIN ASPART 100 UNIT/ML ~~LOC~~ SOLN
0.0000 [IU] | Freq: Three times a day (TID) | SUBCUTANEOUS | Status: DC
  Administered 2019-05-14: 22:00:00 3 [IU] via SUBCUTANEOUS
  Administered 2019-05-15 (×2): 5 [IU] via SUBCUTANEOUS
  Administered 2019-05-15: 2 [IU] via SUBCUTANEOUS
  Administered 2019-05-15: 22:00:00 5 [IU] via SUBCUTANEOUS
  Administered 2019-05-16: 20:00:00 2 [IU] via SUBCUTANEOUS
  Administered 2019-05-16: 13:00:00 3 [IU] via SUBCUTANEOUS
  Administered 2019-05-16: 08:00:00 2 [IU] via SUBCUTANEOUS
  Administered 2019-05-16: 17:00:00 5 [IU] via SUBCUTANEOUS
  Administered 2019-05-17 (×2): 3 [IU] via SUBCUTANEOUS
  Filled 2019-05-14 (×11): qty 1

## 2019-05-14 MED ORDER — ASPIRIN EC 81 MG PO TBEC
81.0000 mg | DELAYED_RELEASE_TABLET | Freq: Every day | ORAL | Status: DC
  Administered 2019-05-15 – 2019-05-16 (×2): 81 mg via ORAL
  Filled 2019-05-14 (×2): qty 1

## 2019-05-14 MED ORDER — LISINOPRIL 10 MG PO TABS
10.0000 mg | ORAL_TABLET | Freq: Every day | ORAL | Status: DC
  Filled 2019-05-14: qty 1

## 2019-05-14 MED ORDER — ENOXAPARIN SODIUM 40 MG/0.4ML ~~LOC~~ SOLN
40.0000 mg | SUBCUTANEOUS | Status: DC
  Administered 2019-05-14: 22:00:00 40 mg via SUBCUTANEOUS
  Filled 2019-05-14: qty 0.4

## 2019-05-14 MED ORDER — SODIUM CHLORIDE 0.9% FLUSH
3.0000 mL | Freq: Two times a day (BID) | INTRAVENOUS | Status: DC
  Administered 2019-05-14 – 2019-05-17 (×6): 3 mL via INTRAVENOUS

## 2019-05-14 MED ORDER — FINASTERIDE 5 MG PO TABS
5.0000 mg | ORAL_TABLET | Freq: Every day | ORAL | Status: DC
  Administered 2019-05-15 – 2019-05-17 (×3): 5 mg via ORAL
  Filled 2019-05-14 (×3): qty 1

## 2019-05-14 MED ORDER — AMLODIPINE BESYLATE 10 MG PO TABS: 10.0000 mg | ORAL_TABLET | Freq: Every day | ORAL | Status: DC

## 2019-05-14 MED ORDER — HYDROCHLOROTHIAZIDE 25 MG PO TABS: 25.0000 mg | ORAL_TABLET | Freq: Every day | ORAL | Status: DC

## 2019-05-14 MED ORDER — FUROSEMIDE 10 MG/ML IJ SOLN
40.0000 mg | Freq: Two times a day (BID) | INTRAMUSCULAR | Status: DC
  Administered 2019-05-14 – 2019-05-16 (×5): 40 mg via INTRAVENOUS
  Filled 2019-05-14 (×5): qty 4

## 2019-05-14 MED ORDER — METOPROLOL TARTRATE 25 MG PO TABS
25.0000 mg | ORAL_TABLET | Freq: Two times a day (BID) | ORAL | Status: DC
  Administered 2019-05-14 – 2019-05-15 (×2): 25 mg via ORAL
  Filled 2019-05-14 (×2): qty 1

## 2019-05-14 MED ORDER — SODIUM CHLORIDE 0.9 % IV SOLN: 250.0000 mL | INTRAVENOUS | Status: DC | PRN

## 2019-05-14 MED ORDER — TERAZOSIN HCL 2 MG PO CAPS
2.0000 mg | ORAL_CAPSULE | Freq: Every day | ORAL | Status: DC
  Administered 2019-05-15 – 2019-05-17 (×3): 2 mg via ORAL
  Filled 2019-05-14 (×3): qty 1

## 2019-05-14 MED ORDER — GLIPIZIDE 5 MG PO TABS
5.0000 mg | ORAL_TABLET | Freq: Two times a day (BID) | ORAL | Status: DC
  Filled 2019-05-14: qty 1

## 2019-05-14 MED ORDER — IOHEXOL 350 MG/ML SOLN
60.0000 mL | Freq: Once | INTRAVENOUS | Status: AC | PRN
  Administered 2019-05-14: 20:00:00 60 mL via INTRAVENOUS

## 2019-05-14 MED ORDER — SODIUM CHLORIDE 0.9% FLUSH: 3.0000 mL | INTRAVENOUS | Status: DC | PRN

## 2019-05-14 NOTE — ED Notes (Signed)
poct covid Negative. 

## 2019-05-14 NOTE — ED Notes (Signed)
Pt in ct scan  

## 2019-05-14 NOTE — H&P (Signed)
[facility] [place] NAME: Andrew Watts    MR#:  [id number]  DATE OF BIRTH:  [birth year]  DATE OF ADMISSION:  05/14/2019  PRIMARY CARE PHYSICIAN: Lucille Passy, PA   REQUESTING/REFERRING PHYSICIAN: Delman Kitten, MD  CHIEF COMPLAINT:  Shortness of breath  HISTORY OF PRESENT ILLNESS:  Andrew Watts  is a 85 y.o. pleasant Caucasian male with a known history of hypertension, dyslipidemia, coronary artery disease and type 2 diabetes mellitus, who presented to the emergency room with acute onset of worsening dyspnea with associated orthopnea and paroxysmal nocturnal dyspnea as well as dyspnea on exertion lately with associated cough occasionally productive of whitish sputum as well as occasional wheezing.  He admitted to chills but denied any fever.  He vomited once last night but denies any recurrent vomiting today.  No diarrhea or abdominal pain.  No headache or dizziness or blurred vision. He admits to bilateral lower extremity edema.  No COVID-19 exposure.  Upon presentation to the emergency room, vital signs were within normal.  Pulse oximetry was 95% on room air and later was down to 90% and came up with 2 L of O2 by nasal cannula to 96%.  Labs were remarkable for a BUN of 48 and creatinine 1.78 without previous levels comparison.  BNP was 1017.  High-sensitivity troponin was 27 and later 22.  CBC showed mild anemia with hemoglobin of 11.7 hematocrit 35.6.  And also showed mild thrombocytopenia with platelets of 122.  COVID-19 PCR is currently pending.  Portable chest x-ray showed prominent interstitial markings within both lungs that may reflect mild edema versus bronchitic type lung changes.  It showed streaky opacity in the right lung base that may reflect atelectasis versus infiltrate and trace bilateral pleural effusions.  The patient had a chest CTA that revealed the following, 1. No evidence for acute pulmonary embolus. 2. Large bilateral pleural effusions with  adjacent atelectasis. 3. Cardiomegaly with interlobular septal thickening, suggestive of interstitial edema. 4. Mild mediastinal adenopathy, likely reactive. 5. Large complex right-sided thyroid nodule measuring approximately 3.4 cm. No follow-up recommended unless clinically warranted (ref: J Am Coll Radiol. 2015 Feb;12(2): 143-50). 6. Aortic Atherosclerosis (ICD10-I70.0).  The patient will be admitted to telemetry bed for further evaluation and management.  PAST MEDICAL HISTORY:   Past Medical History:  Diagnosis Date  . Chronic kidney disease    told 7-8 yrs ago. kidney function only 35%  . Diabetes mellitus without complication ([place])   . Hypercholesteremia   . Myocardial infarction ([place]) 1986   x2  . Wears dentures    full upper and lower    PAST SURGICAL HISTORY:   Past Surgical History:  Procedure Laterality Date  . CARDIAC CATHETERIZATION  1986   stent placed  . CATARACT EXTRACTION W/PHACO Right 02/23/2016   Procedure: CATARACT EXTRACTION PHACO AND INTRAOCULAR LENS PLACEMENT (IOC);  Surgeon: Eulogio Bear, MD;  Location: [facility];  Service: Ophthalmology;  Laterality: Right;  RIGHT DIABETIC - oral meds  . CATARACT EXTRACTION W/PHACO Left 04/05/2016   Procedure: CATARACT EXTRACTION PHACO AND INTRAOCULAR LENS PLACEMENT (IOC);  Surgeon: Eulogio Bear, MD;  Location: [facility];  Service: Ophthalmology;  Laterality: Left;  LEFT DIABETES - oral meds IVA TOPICAL  . [facility]     [facility]    SOCIAL HISTORY:   Social History   Tobacco Use  . Smoking status: Former Smoker    Quit date: 03/28/1985    Years since quitting: 34.1  .  Smokeless tobacco: Never Used  Substance Use Topics  . Alcohol use: No    FAMILY HISTORY:  ellitus and dementia. His father died from leukemia.  His mother died from stomach cancer.  He has a sister with diabetes mellitus and dementia. DRUG ALLERGIES:  No Known Allergies  REVIEW OF SYSTEMS:   ROS As per  history of present illness. All pertinent systems were reviewed above. Constitutional,  HEENT, cardiovascular, respiratory, GI, GU, musculoskeletal, neuro, psychiatric, endocrine,  integumentary and hematologic systems were reviewed and are otherwise  negative/unremarkable except for positive findings mentioned above in the HPI.   MEDICATIONS AT HOME:   Prior to Admission medications   Medication Sig Start Date End Date Taking? Authorizing Provider  amLODipine (NORVASC) 10 MG tablet Take 10 mg by mouth daily.    [provider]  aspirin 81 MG tablet Take 81 mg by mouth daily.    [provider]  finasteride (PROSCAR) 5 MG tablet Take 5 mg by mouth daily.    [provider]  glipiZIDE (GLUCOTROL) 5 MG tablet Take 5 mg by mouth 2 (two) times daily before a meal.    [provider]  hydrochlorothiazide (HYDRODIURIL) 25 MG tablet Take 25 mg by mouth daily.    [provider]  lisinopril (PRINIVIL,ZESTRIL) 10 MG tablet Take 10 mg by mouth daily.    [provider]  metoprolol tartrate (LOPRESSOR) 25 MG tablet Take 25 mg by mouth 2 (two) times daily.    [provider]  simvastatin (ZOCOR) 20 MG tablet Take 20 mg by mouth daily.    [provider]  terazosin (HYTRIN) 2 MG capsule Take 2 mg by mouth daily.    [provider]      VITAL SIGNS:  Blood pressure 140/79, pulse 94, temperature 98.4 F (36.9 C), temperature source Oral, resp. rate (!) 25, height 5\' 11"  (1.803 m), weight 76.8 kg, SpO2 93 %.  PHYSICAL EXAMINATION:  Physical Exam  GENERAL:  85 y.o.-year-old Caucasian male patient lying in the bed with minimal respiratory distress with conversational dyspnea. EYES: Pupils equal, round, reactive to light and accommodation. No scleral icterus. Extraocular muscles intact.  HEENT: Head atraumatic, normocephalic. Oropharynx and nasopharynx clear.  NECK:  Supple, no jugular venous distention. No thyroid  enlargement, no tenderness.  LUNGS: Diminished bibasilar breath sounds with mild bibasal rales. CARDIOVASCULAR: Regular rate and rhythm, S1, S2 normal. No murmurs, rubs, or gallops.  ABDOMEN: Soft, nondistended, nontender. Bowel sounds present. No organomegaly or mass.  EXTREMITIES: 1+ bilateral lower extremity pitting edema with no clubbing or cyanosis.   NEUROLOGIC: Cranial nerves II through XII are intact. Muscle strength 5/5 in all extremities. Sensation intact. Gait not checked.  PSYCHIATRIC: The patient is alert and oriented x 3.  Normal affect and good eye contact. SKIN: No obvious rash, lesion, or ulcer.   LABORATORY PANEL:   CBC Recent Labs  Lab 05/14/19 1421  WBC 9.0  HGB 11.7*  HCT 35.6*  PLT 122*   ------------------------------------------------------------------------------------------------------------------  Chemistries  Recent Labs  Lab 05/14/19 1421  NA 136  K 4.2  CL 107  CO2 20*  GLUCOSE 288*  BUN 48*  CREATININE 1.78*  CALCIUM 9.2  AST 13*  ALT 16  ALKPHOS 69  BILITOT 0.8   ------------------------------------------------------------------------------------------------------------------  Cardiac Enzymes No results for input(s): TROPONINI in the last 168 hours. ------------------------------------------------------------------------------------------------------------------  RADIOLOGY:  DG Chest 2 View  Result Date: 05/14/2019 CLINICAL DATA:  Shortness of breath for 1 week EXAM: CHEST -  2 VIEW COMPARISON:  None. FINDINGS: The heart size and mediastinal contours are within normal limits. Calcific aortic knob. Prominent interstitial markings within both lungs. Streaky opacity within the right lung base. Trace bilateral pleural effusions. No pneumothorax. The visualized skeletal structures are unremarkable. IMPRESSION: 1. Prominent interstitial markings within both lungs which may reflect mild edema versus bronchitic type lung changes. 2. Streaky  opacity within the right lung base which may reflect atelectasis versus infiltrate. 3. Trace bilateral pleural effusions. Electronically Signed   By: Duanne Guess D.O.   On: 05/14/2019 14:56   CT Angio Chest PE W and/or Wo Contrast  Result Date: 05/14/2019 CLINICAL DATA:  Shortness of breath. EXAM: CT ANGIOGRAPHY CHEST WITH CONTRAST TECHNIQUE: Multidetector CT imaging of the chest was performed using the standard protocol during bolus administration of intravenous contrast. Multiplanar CT image reconstructions and MIPs were obtained to evaluate the vascular anatomy. CONTRAST:  52mL OMNIPAQUE IOHEXOL 350 MG/ML SOLN COMPARISON:  None. FINDINGS: Cardiovascular: Contrast injection is sufficient to demonstrate satisfactory opacification of the pulmonary arteries to the segmental level. There is no pulmonary embolus. The main pulmonary artery is within normal limits for size. There is no CT evidence of acute right heart strain. There are thoracic aortic calcifications without evidence for an aneurysm. Heart size is enlarged. Coronary artery calcifications are noted. Mediastinum/Nodes: --there is a slightly prominent right paratracheal lymph node measuring approximately 1.2 cm (axial series 4, image 30). Additional small subcentimeter mediastinal and hilar lymph nodes are noted. --No axillary lymphadenopathy. --No supraclavicular lymphadenopathy. --there is a large complex right-sided thyroid nodule measuring approximately 3.4 x 2.9 cm (axial series 4, image 9). --The esophagus is unremarkable Lungs/Pleura: There are large bilateral pleural effusions with adjacent atelectasis. There is interlobular septal thickening. There is no pneumothorax. No large focal infiltrate. The trachea is unremarkable aside from being shifted to the left by the patient's large right-sided thyroid nodule. Upper Abdomen: The partially visualized gallbladder is distended without definite CT evidence for acute cholecystitis.  Musculoskeletal: No chest wall abnormality. No acute or significant osseous findings. Review of the MIP images confirms the above findings. IMPRESSION: 1. No evidence for acute pulmonary embolus. 2. Large bilateral pleural effusions with adjacent atelectasis. 3. Cardiomegaly with interlobular septal thickening, suggestive of interstitial edema. 4. Mild mediastinal adenopathy, likely reactive. 5. Large complex right-sided thyroid nodule measuring approximately 3.4 cm. No follow-up recommended unless clinically warranted (ref: J Am Coll Radiol. 2015 Feb;12(2): 143-50). 6. Aortic Atherosclerosis (ICD10-I70.0). Electronically Signed   By: Katherine Mantle M.D.   On: 05/14/2019 19:48      IMPRESSION AND PLAN:   1.  New onset acute CHF.  I suspect diastolic etiology.  Patient will be admitted to a telemetry bed.  Serial troponin I's are negative.  Will diurese with IV Lasix.  Will obtain cardiology consultation and 2D echo in a.m. I notified Dr. Welton Flakes about the patient.   2.  Hypertension.  We will continue amlodipine, Lopressor, lisinopril and HCTZ.  3.  Type 2 diabetes mellitus.  We will continue glipizide.  We will place the patient on supplemental coverage with NovoLog.  4.  Dyslipidemia.  We will continue statin therapy.  5.  BPH.  We will continue Proscar and Hytrin.  6.  DVT prophylaxis.  Subcutaneous Lovenox  All the records are reviewed and case discussed with ED provider. The plan of care was discussed in details with the patient (and family). I answered all questions. The patient agreed to proceed with the above mentioned plan.  Further management will depend upon hospital course.   CODE STATUS: Full code  TOTAL TIME TAKING CARE OF THIS PATIENT: 55 minutes.    Hannah Beat M.D on 05/14/2019 at 8:09 PM  [facility]   From 7 PM-7 AM, contact night-coverage [email or web address]  CC: Primary care physician; Noel Gerold, PA   Note: This dictation was prepared with Dragon  dictation along with smaller phrase technology. Any transcriptional errors that result from this process are unintentional.

## 2019-05-14 NOTE — ED Provider Notes (Signed)
[facility] Emergency Department Provider Note   ____________________________________________   First MD Initiated Contact with Patient 05/14/19 [place]     (approximate)  I have reviewed the triage vital signs and the nursing notes.   HISTORY  Chief Complaint Shortness of breath    HPI Blease Rutledge is a 85 y.o. male reports history of diabetes for which she has since been taken off medication, and hypertension.  He had 2 heart attacks back in 1980s or so.  Not been hospitalized or had to have considerable medical care except for blood pressure management.  Patient reports yesterday started develop a dry cough.  Shortness of breath.  Been feeling short of breath since then.  Nothing seems to make it better or worse except it was more noticeable when he tries to lay down flat on his back and last night while trying to lay down.  No swelling except for some slight ankle swelling that is normal for him but not worsened.  No fevers or chills.  Nothing seems to make it better or worse but he reports he is never felt short of breath like this ever before in his life including when he got COVID-19 in March of last year, reports he tested positive then he just had mild symptoms   Denies history of heart failure.  He has had no fever.  Cough dry nonproductive.  Feels short of breath even at rest  Past Medical History:  Diagnosis Date  . Chronic kidney disease    told 7-8 yrs ago. kidney function only 35%  . Diabetes mellitus without complication ([facility])   . Hypercholesteremia   . Myocardial infarction ([facility]) 1986   x2  . Wears dentures    full upper and lower    There are no problems to display for this patient.   Past Surgical History:  Procedure Laterality Date  . CARDIAC CATHETERIZATION  1986   stent placed  . CATARACT EXTRACTION W/PHACO Right 02/23/2016   Procedure: CATARACT EXTRACTION PHACO AND INTRAOCULAR LENS PLACEMENT (IOC);  Surgeon: [place] Crane, MD;  Location: [facility];  Service: Ophthalmology;  Laterality: Right;  RIGHT DIABETIC - oral meds  . CATARACT EXTRACTION W/PHACO Left 04/05/2016   Procedure: CATARACT EXTRACTION PHACO AND INTRAOCULAR LENS PLACEMENT (IOC);  Surgeon: Nevada Crane, MD;  Location[facility];  Service: Ophthalmology;  Laterality: Left;  LEFT DIABETES - oral meds IVA TOPICAL  . [facility]    Prior to Admission medications   Medication Sig Start Date End Date Taking? Authorizing Provider  amLODipine (NORVASC) 10 MG tablet Take 10 mg by mouth daily.    [provider]  aspirin 81 MG tablet Take 81 mg by mouth daily.    [provider]  finasteride (PROSCAR) 5 MG tablet Take 5 mg by mouth daily.    [provider]         hydrochlorothiazide (HYDRODIURIL) 25 MG tablet Take 25 mg by mouth daily.    [provider]  lisinopril (PRINIVIL,ZESTRIL) 10 MG tablet Take 10 mg by mouth daily.    [provider]  metoprolol tartrate (LOPRESSOR) 25 MG tablet Take 25 mg by mouth 2 (two) times daily.    [provider]  simvastatin (ZOCOR) 20 MG tablet Take 20 mg by mouth daily.    [provider]  terazosin (HYTRIN) 2 MG capsule Take 2 mg by mouth daily.    [provider]  Allergies Patient has no known allergies.  No family history on file.  Social History Social History   Tobacco Use  . Smoking status: Former Smoker    Quit date: 03/28/1985    Years since quitting: 34.1  . Smokeless tobacco: Never Used  Substance Use Topics  . Alcohol use: No  . Drug use: Not on file    Review of Systems Constitutional: No fever/chills.  Some fatigue short of breath when he exerts himself or even at rest Eyes: No visual changes. ENT: No sore throat. Cardiovascular: Denies chest pain. Respiratory: See HPI gastrointestinal: No abdominal pain.   Genitourinary: Negative for dysuria. Musculoskeletal: Negative for back  pain.  Chronically has a little bit of swelling about his ankles, has not noticed anything new.  No new weight gain.  If anything he reports he is lost a little bit of weight and constantly has to keep added maintain his weight Skin: Negative for rash. Neurological: Negative for headaches or areas of weakness.    ____________________________________________   PHYSICAL EXAM:  VITAL SIGNS: ED Triage Vitals  Enc Vitals Group     BP 05/14/19 1407 (!) 147/79     Pulse Rate 05/14/19 1407 95     Resp 05/14/19 1407 16     Temp 05/14/19 1407 98.1 F (36.7 C)     Temp Source 05/14/19 1407 Oral     SpO2 05/14/19 1407 93 %     Weight 05/14/19 1408 169 lb 5 oz (76.8 kg)     Height 05/14/19 1408 5\' 11"  (1.803 m)     Head Circumference --      Peak Flow --      Pain Score 05/14/19 1408 0     Pain Loc --      Pain Edu? --      Excl. in GC? --     Constitutional: Alert and oriented.  Very pleasant.  Patient is slightly dyspneic with some slight tachypnea.  Oxygen saturation about 9190% on room air Eyes: Conjunctivae are normal. Head: Atraumatic. Nose: No congestion/rhinnorhea. Mouth/Throat: Mucous membranes are moist. Neck: No stridor.  Cardiovascular: Normal rate, regular rhythm. Grossly normal heart sounds.  Good peripheral circulation. Respiratory: No acute distress no retractions. Lungs CTAB.  Occasional dry cough.  No wheezing.  Mild use of accessory muscles.  Speaks in phrases Gastrointestinal: Soft and nontender. No distention. Musculoskeletal: No lower extremity tenderness is trace bilateral ankle edema. Neurologic:  Normal speech and language. No gross focal neurologic deficits are appreciated.  Skin:  Skin is warm, dry and intact. No rash noted. Psychiatric: Mood and affect are normal. Speech and behavior are normal.  ____________________________________________   LABS (all labs ordered are listed, but only abnormal results are displayed)  Labs Reviewed  CBC WITH  DIFFERENTIAL/PLATELET - Abnormal; Notable for the following components:      Result Value   RBC 3.88 (*)    Hemoglobin 11.7 (*)    HCT 35.6 (*)    Platelets 122 (*)    All other components within normal limits  COMPREHENSIVE METABOLIC PANEL - Abnormal; Notable for the following components:   CO2 20 (*)    Glucose, Bld 288 (*)    BUN 48 (*)    Creatinine, Ser 1.78 (*)    AST 13 (*)    GFR calc non Af Amer 35 (*)    GFR calc Af Amer 40 (*)    All other components within normal limits  BRAIN NATRIURETIC PEPTIDE - Abnormal; Notable  for the following components:   B Natriuretic Peptide 1,017.0 (*)    All other components within normal limits  TROPONIN I (HIGH SENSITIVITY) - Abnormal; Notable for the following components:   Troponin I (High Sensitivity) 27 (*)    All other components within normal limits  TROPONIN I (HIGH SENSITIVITY) - Abnormal; Notable for the following components:   Troponin I (High Sensitivity) 22 (*)    All other components within normal limits  SARS CORONAVIRUS 2 (TAT 6-24 HRS)  POC SARS CORONAVIRUS 2 AG -  ED  POC SARS CORONAVIRUS 2 AG   ____________________________________________  EKG  Reviewed inter by me at 1415 Heart rate 95 QRS 90 QTc 440 Normal sinus rhythm, occasional PAC.  Slight baseline artifact.  Nonspecific T wave abnormality.  No evidence of acute ischemia ____________________________________________  RADIOLOGY  DG Chest 2 View  Result Date: 05/14/2019 CLINICAL DATA:  Shortness of breath for 1 week EXAM: CHEST - 2 VIEW COMPARISON:  None. FINDINGS: The heart size and mediastinal contours are within normal limits. Calcific aortic knob. Prominent interstitial markings within both lungs. Streaky opacity within the right lung base. Trace bilateral pleural effusions. No pneumothorax. The visualized skeletal structures are unremarkable. IMPRESSION: 1. Prominent interstitial markings within both lungs which may reflect mild edema versus bronchitic  type lung changes. 2. Streaky opacity within the right lung base which may reflect atelectasis versus infiltrate. 3. Trace bilateral pleural effusions. Electronically Signed   By: Davina Poke D.O.   On: 05/14/2019 14:56    Chest x-ray reviewed, prominent interstitial markings.  Also streaky opacity right lung base.  Trace bilateral pleural effusions.  Personally viewed by me. ____________________________________________   PROCEDURES  Procedure(s) performed: None  Procedures  Critical Care performed: No  ____________________________________________   INITIAL IMPRESSION / ASSESSMENT AND PLAN / ED COURSE  Pertinent labs & imaging results that were available during my care of the patient were reviewed by me and considered in my medical decision making (see chart for details).   Patient comes for evaluation of dyspnea primarily developing over the last day.  He has some slight evidence of trace bilateral lower extremity edema.  Denies infectious symptoms.  Chest x-ray however is concerning for possible volume overload, however also atypical pneumonias with pneumonia are considered.  He does not have any chest pain.  No minimally elevated troponins, no noted baseline.  Does not appear to represent ACS.  I am however concerned this could represent etiology such as infectious, heart failure, or other acute or developing cardiopulmonary abnormality.  He reports previous infection of COVID-19 a year ago in March.  We will retest him today, as recurrent infection could be possible given his symptoms of shortness of breath with dry cough and imaging findings    Covid test negative.  Discussed with the patient, will proceed with CT angiogram PE study to evaluate for etiology of dyspnea, exclude pulmonary embolism.  Also this will help [place] I suspect diagnostically with regard to other etiologies including possible infectious causes or volume overload  etc.   ----------------------------------------- 7:30 PM on 05/14/2019 -----------------------------------------  Ongoing ER care assigned to Dr. Quentin Cornwall.  Patient is pending admission to the hospital for further work-up of his dyspnea.  CT angiogram is pending, follow-up on results.  Admission discussed and accepted by Dr. Sidney Ace (aware CT scan result pending).  Additionally, patient identifies that he typically gets his primary care through the [place] system.  He does however, declined transfer to the [facility].  He wishes to be hospitalized here. Printed copy of Refusal of Transfer to [facility] provided to patient by nursing for his review/completion.     ____________________________________________   FINAL CLINICAL IMPRESSION(S) / ED DIAGNOSES  Final diagnoses:  Shortness of breath        Note:  This document was prepared using Dragon voice recognition software and may include unintentional dictation errors       Sharyn Creamer, MD 05/14/19 1943

## 2019-05-14 NOTE — ED Notes (Signed)
Resumed care from ellen rn.  Pt alert.  Pt placed on 2 liters oxygen [place].

## 2019-05-14 NOTE — ED Notes (Signed)
Pt talking to family via phone

## 2019-05-14 NOTE — ED Notes (Signed)
Pt signed refusal to go to [facility] hospital .  Pt alert  afib on monitor.  No chest pain.  Pt on 2 liters oxygen [place].  Iv in place.

## 2019-05-14 NOTE — ED Notes (Signed)
Report called to edna rn floor nurse

## 2019-05-14 NOTE — ED Triage Notes (Signed)
C/O SOB x 1 week.  Worse last night.    Denies pain.  Speaking in full sentences. No SOB/ DOE. Noted.

## 2019-05-15 ENCOUNTER — Inpatient Hospital Stay: Payer: Medicare Other

## 2019-05-15 ENCOUNTER — Inpatient Hospital Stay
Admit: 2019-05-15 | Discharge: 2019-05-15 | Disposition: A | Discharge status: Home / Self Care | Payer: Medicare Other | Attending: Family Medicine | Admitting: Family Medicine

## 2019-05-15 DIAGNOSIS — J90 Pleural effusion, not elsewhere classified: Secondary | ICD-10-CM

## 2019-05-15 DIAGNOSIS — I4891 Unspecified atrial fibrillation: Secondary | ICD-10-CM

## 2019-05-15 DIAGNOSIS — I5021 Acute systolic (congestive) heart failure: Secondary | ICD-10-CM

## 2019-05-15 DIAGNOSIS — E785 Hyperlipidemia, unspecified: Secondary | ICD-10-CM

## 2019-05-15 DIAGNOSIS — N1832 Chronic kidney disease, stage 3b: Secondary | ICD-10-CM

## 2019-05-15 DIAGNOSIS — N400 Enlarged prostate without lower urinary tract symptoms: Secondary | ICD-10-CM

## 2019-05-15 DIAGNOSIS — R0600 Dyspnea, unspecified: Secondary | ICD-10-CM

## 2019-05-15 LAB — CBC WITH DIFFERENTIAL/PLATELET
Abs Immature Granulocytes: 0.03 10*3/uL (ref 0.00–0.07)
Basophils Absolute: 0.1 10*3/uL (ref 0.0–0.1)
Basophils Relative: 1 %
Eosinophils Absolute: 0 10*3/uL (ref 0.0–0.5)
Eosinophils Relative: 0 %
HCT: 33.3 % — ABNORMAL LOW (ref 39.0–52.0)
Hemoglobin: 11 g/dL — ABNORMAL LOW (ref 13.0–17.0)
Immature Granulocytes: 0 %
Lymphocytes Relative: 13 %
Lymphs Abs: 1 10*3/uL (ref 0.7–4.0)
MCH: 30.1 pg (ref 26.0–34.0)
MCHC: 33 g/dL (ref 30.0–36.0)
MCV: 91.2 fL (ref 80.0–100.0)
Monocytes Absolute: 0.8 10*3/uL (ref 0.1–1.0)
Monocytes Relative: 10 %
Neutro Abs: 6.1 10*3/uL (ref 1.7–7.7)
Neutrophils Relative %: 76 %
Platelets: 127 10*3/uL — ABNORMAL LOW (ref 150–400)
RBC: 3.65 MIL/uL — ABNORMAL LOW (ref 4.22–5.81)
RDW: 12.3 % (ref 11.5–15.5)
WBC: 8 10*3/uL (ref 4.0–10.5)
nRBC: 0 % (ref 0.0–0.2)

## 2019-05-15 LAB — BASIC METABOLIC PANEL
Anion gap: 10 (ref 5–15)
BUN: 40 mg/dL — ABNORMAL HIGH (ref 8–23)
CO2: 20 mmol/L — ABNORMAL LOW (ref 22–32)
Calcium: 8.9 mg/dL (ref 8.9–10.3)
Chloride: 109 mmol/L (ref 98–111)
Creatinine, Ser: 1.68 mg/dL — ABNORMAL HIGH (ref 0.61–1.24)
GFR calc Af Amer: 43 mL/min — ABNORMAL LOW (ref >60)
GFR calc non Af Amer: 37 mL/min — ABNORMAL LOW (ref >60)
Glucose, Bld: 225 mg/dL — ABNORMAL HIGH (ref 70–99)
Potassium: 3.7 mmol/L (ref 3.5–5.1)
Sodium: 139 mmol/L (ref 135–145)

## 2019-05-15 LAB — BODY FLUID CELL COUNT WITH DIFFERENTIAL
Eos, Fluid: 0 %
Lymphs, Fluid: 59 %
Monocyte-Macrophage-Serous Fluid: 39 %
Neutrophil Count, Fluid: 2 %
Other Cells, Fluid: 0 %
Total Nucleated Cell Count, Fluid: 42 cu mm

## 2019-05-15 LAB — GLUCOSE, CAPILLARY
Glucose-Capillary: 199 mg/dL — ABNORMAL HIGH (ref 70–99)
Glucose-Capillary: 297 mg/dL — ABNORMAL HIGH (ref 70–99)
Glucose-Capillary: 287 mg/dL — ABNORMAL HIGH (ref 70–99)
Glucose-Capillary: 269 mg/dL — ABNORMAL HIGH (ref 70–99)

## 2019-05-15 LAB — ECHOCARDIOGRAM COMPLETE
Height: 71 in
Weight: 2716.07 oz

## 2019-05-15 LAB — SARS CORONAVIRUS 2 (TAT 6-24 HRS): SARS Coronavirus 2: NEGATIVE

## 2019-05-15 LAB — LACTATE DEHYDROGENASE, PLEURAL OR PERITONEAL FLUID: LD, Fluid: 75 U/L — ABNORMAL HIGH (ref 3–23)

## 2019-05-15 LAB — PROTEIN, PLEURAL OR PERITONEAL FLUID: Total protein, fluid: <3 g/dL

## 2019-05-15 LAB — HEMOGLOBIN A1C
Hgb A1c MFr Bld: 11.3 % — ABNORMAL HIGH (ref 4.8–5.6)
Mean Plasma Glucose: 277.61 mg/dL

## 2019-05-15 MED ORDER — DILTIAZEM HCL 25 MG/5ML IV SOLN
5.0000 mg | Freq: Once | INTRAVENOUS | Status: AC
  Administered 2019-05-15: 07:00:00 5 mg via INTRAVENOUS
  Filled 2019-05-15: qty 5

## 2019-05-15 MED ORDER — DIGOXIN 125 MCG PO TABS
0.1250 mg | ORAL_TABLET | Freq: Every day | ORAL | Status: DC
  Administered 2019-05-16: 08:00:00 0.125 mg via ORAL
  Filled 2019-05-15 (×2): qty 1

## 2019-05-15 MED ORDER — DIGOXIN 125 MCG PO TABS
0.1250 mg | ORAL_TABLET | Freq: Once | ORAL | Status: AC
  Administered 2019-05-15: 05:00:00 0.125 mg via ORAL
  Filled 2019-05-15: qty 1

## 2019-05-15 MED ORDER — METOPROLOL TARTRATE 25 MG PO TABS
25.0000 mg | ORAL_TABLET | Freq: Three times a day (TID) | ORAL | Status: DC
  Administered 2019-05-15 – 2019-05-16 (×3): 25 mg via ORAL
  Filled 2019-05-15 (×3): qty 1

## 2019-05-15 MED ORDER — ENOXAPARIN SODIUM 40 MG/0.4ML ~~LOC~~ SOLN: 40.0000 mg | SUBCUTANEOUS | Status: DC

## 2019-05-15 MED ORDER — DIGOXIN 0.25 MG/ML IJ SOLN: 0.1250 mg | Freq: Once | INTRAMUSCULAR | Status: DC | PRN

## 2019-05-15 MED ORDER — DIGOXIN 0.25 MG/ML IJ SOLN
0.2500 mg | Freq: Once | INTRAMUSCULAR | Status: AC
  Administered 2019-05-15: 16:00:00 0.25 mg via INTRAVENOUS
  Filled 2019-05-15: qty 2

## 2019-05-15 MED ORDER — PERFLUTREN LIPID MICROSPHERE
1.0000 mL | INTRAVENOUS | Status: AC | PRN
  Administered 2019-05-15: 2 mL via INTRAVENOUS
  Filled 2019-05-15: qty 10

## 2019-05-15 NOTE — Procedures (Signed)
Interventional Radiology Procedure Note  Procedure: US guided right thoracentesis  Complications: None  Estimated Blood Loss: None  Findings: US shows bilateral effusions, R>L  700 mL of clear, yellow fluid removed from right pleural space. Post CXR pending.  Jodi Marble. Fredia Sorrow, M.D Pager:  [phone]

## 2019-05-15 NOTE — Plan of Care (Signed)
  Problem: Activity: Goal: Risk for activity intolerance will decrease Outcome: Completed/Met

## 2019-05-15 NOTE — Progress Notes (Signed)
Dr. Renae Gloss on the floor made aware patients heart rate continues to range from 100's to 130's. Scheduled po metoprolol given, will continue to monitor closely

## 2019-05-15 NOTE — Progress Notes (Addendum)
Inpatient Diabetes Program Recommendations  AACE/ADA: New Consensus Statement on Inpatient Glycemic Control (2015)  Target Ranges:  Prepandial:   less than 140 mg/dL      Peak postprandial:   less than 180 mg/dL (1-2 hours)      Critically ill patients:  140 - 180 mg/dL   Lab Results  Component Value Date   GLUCAP 297 (H) 05/15/2019   HGBA1C 11.3 (H) 05/14/2019    Review of Glycemic Control Results for Andrew Watts, Andrew Watts (MRN [id number]) as of 05/15/2019 13:14  Ref. Range 05/14/2019 21:53 05/15/2019 08:21 05/15/2019 12:53  Glucose-Capillary Latest Ref Range: 70 - 99 mg/dL 025 (H) 427 (H) 062 (H)   Diabetes history: DM 2 Outpatient Diabetes medications:  Glucotrol 5 mg bid Current orders for Inpatient glycemic control:  Novolog sensitive tid with meals and HS  Inpatient Diabetes Program Recommendations:    Please consider adding Lantus 12 units daily. A1C indicates poorly controlled DM prior to admit.  May need insulin at d/c.  Will attempt to see patient and wife this afternoon.  Thanks  Beryl Meager, RN, BC-ADM Inpatient Diabetes Coordinator Pager [phone] (8a-5p)  1500 Addendum:  Spoke with patient/wife regarding elevated A1C.  Patient was resting most of the time.  Wife states that patient was taken off Glucotrol some time ago due to his kidney function.  She states that the doctor indicated that his diabetes was better and A1C was normal so they stopped checking blood sugars. Explained current A1C and that he will likely need medication resumed for diabetes along with CBG monitoring.  Wife states she will check blood sugars at home.  Reviewed normal blood sugar values as well.  Unclear what medication needs will be at d/c.  May consider Tradjenta 5 mg daily (not renally cleared and low risk for low blood sugars).  Patient see's Dr. Dava Najjar and plans to follow up with him.  Wife is concerned about patients reduced intake for the past 4 weeks.  Will place dietician consult as well.

## 2019-05-15 NOTE — Progress Notes (Signed)
Per dr. Renae Gloss hold scheduled dose of lisinopril 10mg  po due to bp 106/77. Will continue to monitor

## 2019-05-15 NOTE — Progress Notes (Signed)
PT Cancellation Note  Patient Details Name: Andrew Watts MRN: [id number] DOB: 06-10-1934   Cancelled Treatment:    Reason Eval/Treat Not Completed: Medical issues which prohibited therapy(Per chart review most recent HR 140s. Will hold PT evaluation at this time and evaluate once medially ready.)  4:41 PM, 05/15/19 Rosamaria Lints, PT, DPT Physical Therapist - [facility]  [phone] (ASCOM)    [name] 05/15/2019, 4:40 PM

## 2019-05-15 NOTE — Consult Note (Addendum)
Cardiology Consultation Note    Patient ID: Andrew Watts, MRN: [id number], DOB/AGE: 10/04/1934 85 y.o. Admit date: 05/14/2019   Date of Consult: 05/15/2019 Primary Physician: Noel Gerold, PA Primary Cardiologist: Lady Gary  Chief Complaint: sob Reason for Consultation: afib with rvr/chf Requesting MD: Renae Gloss  HPI: Andrew Watts is a 85 y.o. male with history of hypertension, hyperlipidemia, coronary artery disease and type 2 diabetes who was admitted on 05/14/2019 with worsening dyspnea and shortness of breath.  He had chills but no fever.  His pulse oximetry on room air was 95% on presentation reduced to 90% and improved to 2 L per nasal cannula.  He had a BUN/creatinine of 48 and 1.78.  BNP was 1017.  High-sensitivity troponin was minimally elevated at 27 and 22.  Is Covid negative.  Chest CTA revealed no acute pulmonary embolus with large bilateral pleural effusions with cardiomegaly and possible mild pulmonary edema.  He was treated as an outpatient with amlodipine at 10 mg daily, aspirin 81 mg daily, hydrochlorothiazide 25 mg daily, lisinopril 10 mg daily and metoprolol tartrate 25 mg twice daily and.  He is followed at the [facility].  EKG on presentation revealed sinus rhythm.  He subsequently developed atrial fibrillation with variable rate fairly rapid ventricular response.  His wife reports symptoms of sleep apnea.  He underwent thoracentesis today with removal of 700 cc of fluid from the right pleural space.  Currently feels less short of breath but continues to have variable ventricular response with his A. fib.  He is not currently anticoagulated.  CHA2DS2-VASc score is at least 5.  Past Medical History:  Diagnosis Date  . Chronic kidney disease    told 7-8 yrs ago. kidney function only 35%  . Diabetes mellitus without complication (HCC)   . Hypercholesteremia   . Myocardial infarction (HCC) 1986   x2  . Wears dentures    full upper and lower      Surgical History:   Past Surgical History:  Procedure Laterality Date  . CARDIAC CATHETERIZATION  1986   stent placed  . CATARACT EXTRACTION W/PHACO Right 02/23/2016   Procedure: CATARACT EXTRACTION PHACO AND INTRAOCULAR LENS PLACEMENT (IOC);  Surgeon: Nevada Crane, MD;  Location: [facility];  Service: Ophthalmology;  Laterality: Right;  RIGHT DIABETIC - oral meds  . CATARACT EXTRACTION W/PHACO Left 04/05/2016   Procedure: CATARACT EXTRACTION PHACO AND INTRAOCULAR LENS PLACEMENT (IOC);  Surgeon: Nevada Crane, MD;  Location: [facility];  Service: Ophthalmology;  Laterality: Left;  LEFT DIABETES - oral meds IVA TOPICAL  . [facility]     [facility]     Home Meds: Prior to Admission medications   Medication Sig Start Date End Date Taking? Authorizing Provider  amLODipine (NORVASC) 10 MG tablet Take 10 mg by mouth daily.   Yes [provider]  aspirin 81 MG tablet Take 81 mg by mouth daily.   Yes [provider]  finasteride (PROSCAR) 5 MG tablet Take 5 mg by mouth daily.   Yes [provider]  glipiZIDE (GLUCOTROL) 5 MG tablet Take 5 mg by mouth 2 (two) times daily before a meal.   Yes [provider]  hydrochlorothiazide (HYDRODIURIL) 25 MG tablet Take 25 mg by mouth daily.   Yes [provider]  lisinopril (PRINIVIL,ZESTRIL) 10 MG tablet Take 10 mg by mouth daily.   Yes [provider]  metoprolol tartrate (LOPRESSOR) 25 MG tablet Take 25 mg by mouth 2 (two) times daily.  Yes [provider]  simvastatin (ZOCOR) 20 MG tablet Take 20 mg by mouth daily.   Yes [provider]  terazosin (HYTRIN) 2 MG capsule Take 2 mg by mouth daily.   Yes [provider]    Inpatient Medications:  . aspirin EC  81 mg Oral Daily  . [START ON 05/16/2019] enoxaparin (LOVENOX) injection  40 mg Subcutaneous Q24H  . finasteride  5 mg Oral Daily  . furosemide  40 mg Intravenous Q12H  . insulin aspart  0-9 Units Subcutaneous TID  PC & HS  . lisinopril  10 mg Oral Daily  . metoprolol tartrate  25 mg Oral TID  . simvastatin  20 mg Oral Daily  . sodium chloride flush  3 mL Intravenous Q12H  . terazosin  2 mg Oral Daily   . sodium chloride      Allergies: No Known Allergies  Social History   Socioeconomic History  . Marital status: Married    Spouse name: Not on file  . Number of children: Not on file  . Years of education: Not on file  . Highest education level: Not on file  Occupational History  . Not on file  Tobacco Use  . Smoking status: Former Smoker    Quit date: 03/28/1985    Years since quitting: 34.1  . Smokeless tobacco: Never Used  Substance and Sexual Activity  . Alcohol use: No  . Drug use: Not on file  . Sexual activity: Not on file  Other Topics Concern  . Not on file  Social History Narrative  . Not on file   Social Determinants of Health   Financial Resource Strain:   . Difficulty of Paying Living Expenses: Not on file  Food Insecurity:   . Worried About Programme researcher, broadcasting/film/video in the Last Year: Not on file  . Ran Out of Food in the Last Year: Not on file  Transportation Needs:   . Lack of Transportation (Medical): Not on file  . Lack of Transportation (Non-Medical): Not on file  Physical Activity:   . Days of Exercise per Week: Not on file  . Minutes of Exercise per Session: Not on file  Stress:   . Feeling of Stress : Not on file  Social Connections:   . Frequency of Communication with Friends and Family: Not on file  . Frequency of Social Gatherings with Friends and Family: Not on file  . Attends Religious Services: Not on file  . Active Member of Clubs or Organizations: Not on file  . Attends Banker Meetings: Not on file  . Marital Status: Not on file  Intimate Partner Violence:   . Fear of Current or Ex-Partner: Not on file  . Emotionally Abused: Not on file  . Physically Abused: Not on file  . Sexually Abused: Not on file     History reviewed. No  pertinent family history.   Review of Systems: A 12-system review of systems was performed and is negative except as noted in the HPI.  Labs: No results for input(s): CKTOTAL, CKMB, TROPONINI in the last 72 hours. Lab Results  Component Value Date   WBC 8.0 05/15/2019   HGB 11.0 (L) 05/15/2019   HCT 33.3 (L) 05/15/2019   MCV 91.2 05/15/2019   PLT 127 (L) 05/15/2019    Recent Labs  Lab 05/14/19 1421 05/14/19 1421 05/15/19 0447  NA 136   < > 139  K 4.2   < > 3.7  CL 107   < >  109  CO2 20*   < > 20*  BUN 48*   < > 40*  CREATININE 1.78*   < > 1.68*  CALCIUM 9.2   < > 8.9  PROT 7.2  --   --   BILITOT 0.8  --   --   ALKPHOS 69  --   --   ALT 16  --   --   AST 13*  --   --   GLUCOSE 288*   < > 225*   < > = values in this interval not displayed.   No results found for: CHOL, HDL, LDLCALC, TRIG No results found for: DDIMER  Radiology/Studies:  DG Chest 1 View  Result Date: 05/15/2019 CLINICAL DATA:  Post thoracentesis, 700 mL from the right side. EXAM: CHEST  1 VIEW COMPARISON:  05/14/2019 FINDINGS: Cardiomediastinal contours are normal. Lung volumes are slightly low. Hilar structures are unremarkable. Blunting of left costo diaphragmatic sulcus. Improved aeration at the right lung base following evacuation of pleural fluid in the right chest. No visible pneumothorax. No consolidation. Mild increased interstitial markings persist. Visualized skeletal structures are unremarkable. IMPRESSION: Persistent mild pulmonary edema, status post right thoracentesis without visible pneumothorax. Persistent left-sided pleural effusion and basilar atelectasis. Electronically Signed   By: Donzetta Kohut M.D.   On: 05/15/2019 12:54   DG Chest 2 View  Result Date: 05/14/2019 CLINICAL DATA:  Shortness of breath for 1 week EXAM: CHEST - 2 VIEW COMPARISON:  None. FINDINGS: The heart size and mediastinal contours are within normal limits. Calcific aortic knob. Prominent interstitial markings within  both lungs. Streaky opacity within the right lung base. Trace bilateral pleural effusions. No pneumothorax. The visualized skeletal structures are unremarkable. IMPRESSION: 1. Prominent interstitial markings within both lungs which may reflect mild edema versus bronchitic type lung changes. 2. Streaky opacity within the right lung base which may reflect atelectasis versus infiltrate. 3. Trace bilateral pleural effusions. Electronically Signed   By: Duanne Guess D.O.   On: 05/14/2019 14:56   CT Angio Chest PE W and/or Wo Contrast  Result Date: 05/14/2019 CLINICAL DATA:  Shortness of breath. EXAM: CT ANGIOGRAPHY CHEST WITH CONTRAST TECHNIQUE: Multidetector CT imaging of the chest was performed using the standard protocol during bolus administration of intravenous contrast. Multiplanar CT image reconstructions and MIPs were obtained to evaluate the vascular anatomy. CONTRAST:  11mL OMNIPAQUE IOHEXOL 350 MG/ML SOLN COMPARISON:  None. FINDINGS: Cardiovascular: Contrast injection is sufficient to demonstrate satisfactory opacification of the pulmonary arteries to the segmental level. There is no pulmonary embolus. The main pulmonary artery is within normal limits for size. There is no CT evidence of acute right heart strain. There are thoracic aortic calcifications without evidence for an aneurysm. Heart size is enlarged. Coronary artery calcifications are noted. Mediastinum/Nodes: --there is a slightly prominent right paratracheal lymph node measuring approximately 1.2 cm (axial series 4, image 30). Additional small subcentimeter mediastinal and hilar lymph nodes are noted. --No axillary lymphadenopathy. --No supraclavicular lymphadenopathy. --there is a large complex right-sided thyroid nodule measuring approximately 3.4 x 2.9 cm (axial series 4, image 9). --The esophagus is unremarkable Lungs/Pleura: There are large bilateral pleural effusions with adjacent atelectasis. There is interlobular septal thickening.  There is no pneumothorax. No large focal infiltrate. The trachea is unremarkable aside from being shifted to the left by the patient's large right-sided thyroid nodule. Upper Abdomen: The partially visualized gallbladder is distended without definite CT evidence for acute cholecystitis. Musculoskeletal: No chest wall abnormality. No acute or significant osseous  findings. Review of the MIP images confirms the above findings. IMPRESSION: 1. No evidence for acute pulmonary embolus. 2. Large bilateral pleural effusions with adjacent atelectasis. 3. Cardiomegaly with interlobular septal thickening, suggestive of interstitial edema. 4. Mild mediastinal adenopathy, likely reactive. 5. Large complex right-sided thyroid nodule measuring approximately 3.4 cm. No follow-up recommended unless clinically warranted (ref: J Am Coll Radiol. 2015 Feb;12(2): 143-50). 6. Aortic Atherosclerosis (ICD10-I70.0). Electronically Signed   By: Katherine Mantle M.D.   On: 05/14/2019 19:48   ECHOCARDIOGRAM COMPLETE  Result Date: 05/15/2019    ECHOCARDIOGRAM REPORT   Patient Name:   Andrew Watts Date of Exam: 05/15/2019 Medical Rec #:  [id number]     Height:       71.0 in Accession #:    [id number]    Weight:       169.8 lb Date of Birth:  08-18-1934     BSA:          1.97 m Patient Age:    84 years      BP:           112/83 mmHg Patient Gender: M             HR:           109 bpm. Exam Location:  [facility] Procedure: 2D Echo, Color Doppler, Cardiac Doppler and Intracardiac            Opacification Agent Indications:     R06.00 Dyspnea  History:         Patient has no prior history of Echocardiogram examinations.                  Previous Myocardial Infarction, CKD; Risk Factors:Diabetes and                  HCL.  Sonographer:     Humphrey Rolls RDCS (AE) Referring Phys:  [id number] Vernetta Honey MANSY Diagnosing Phys: Harold Hedge MD  Sonographer Comments: Suboptimal apical window and suboptimal subcostal window. IMPRESSIONS  1. Left ventricular ejection  fraction, by estimation, is 35 to 40%. The left ventricle has normal function. The left ventricle has no regional wall motion abnormalities. The left ventricular internal cavity size was mildly dilated. Left ventricular diastolic parameters were normal.  2. Right ventricular systolic function is normal. The right ventricular size is mildly enlarged.  3. Left atrial size was mildly dilated.  4. Right atrial size was mildly dilated.  5. The mitral valve is degenerative. Trivial mitral valve regurgitation.  6. The aortic valve is tricuspid. Aortic valve regurgitation is not visualized. FINDINGS  Left Ventricle: Left ventricular ejection fraction, by estimation, is 35 to 40%. The left ventricle has normal function. The left ventricle has no regional wall motion abnormalities. Definity contrast agent was given IV to delineate the left ventricular  endocardial borders. The left ventricular internal cavity size was mildly dilated. There is no left ventricular hypertrophy. Left ventricular diastolic parameters were normal. Right Ventricle: The right ventricular size is mildly enlarged. No increase in right ventricular wall thickness. Right ventricular systolic function is normal. Left Atrium: Left atrial size was mildly dilated. Right Atrium: Right atrial size was mildly dilated. Pericardium: There is no evidence of pericardial effusion. Mitral Valve: The mitral valve is degenerative in appearance. Trivial mitral valve regurgitation. MV peak gradient, 2.9 mmHg. The mean mitral valve gradient is 1.0 mmHg. Tricuspid Valve: The tricuspid valve is normal in structure. Tricuspid valve regurgitation is trivial. Aortic Valve: The aortic valve is tricuspid.  Aortic valve regurgitation is not visualized. Aortic valve mean gradient measures 1.0 mmHg. Aortic valve peak gradient measures 2.4 mmHg. Aortic valve area, by VTI measures 4.25 cm. Pulmonic Valve: The pulmonic valve was not well visualized. Pulmonic valve regurgitation is not  visualized. Aorta: The aortic root is normal in size and structure. IAS/Shunts: No atrial level shunt detected by color flow Doppler.  LEFT VENTRICLE PLAX 2D LVIDd:         5.77 cm      Diastology LVIDs:         4.84 cm      LV e' lateral:   8.38 cm/s LV PW:         1.04 cm      LV E/e' lateral: 7.7 LV IVS:        0.82 cm      LV e' medial:    9.14 cm/s LVOT diam:     2.50 cm      LV E/e' medial:  7.1 LV SV:         45.50 ml LV SV Index:   27.94 LVOT Area:     4.91 cm  LV Volumes (MOD) LV vol d, MOD A2C: 122.0 ml LV vol d, MOD A4C: 131.0 ml LV vol s, MOD A2C: 72.5 ml LV vol s, MOD A4C: 81.8 ml LV SV MOD A2C:     49.5 ml LV SV MOD A4C:     131.0 ml LV SV MOD BP:      48.6 ml RIGHT VENTRICLE RV Basal diam:  3.02 cm LEFT ATRIUM             Index       RIGHT ATRIUM           Index LA diam:        4.40 cm 2.24 cm/m  RA Area:     19.30 cm LA Vol (A2C):   64.7 ml 32.90 ml/m RA Volume:   60.90 ml  30.97 ml/m LA Vol (A4C):   80.0 ml 40.68 ml/m LA Biplane Vol: 75.5 ml 38.39 ml/m  AORTIC VALVE                   PULMONIC VALVE AV Area (Vmax):    3.93 cm    PV Vmax:       0.92 m/s AV Area (Vmean):   3.63 cm    PV Vmean:      59.400 cm/s AV Area (VTI):     4.25 cm    PV VTI:        0.129 m AV Vmax:           77.60 cm/s  PV Peak grad:  3.4 mmHg AV Vmean:          51.800 cm/s PV Mean grad:  2.0 mmHg AV VTI:            0.107 m AV Peak Grad:      2.4 mmHg AV Mean Grad:      1.0 mmHg LVOT Vmax:         62.10 cm/s LVOT Vmean:        38.300 cm/s LVOT VTI:          0.093 m LVOT/AV VTI ratio: 0.87  AORTA Ao Root diam: 3.70 cm MITRAL VALVE MV Area (PHT): 7.02 cm    SHUNTS MV Peak grad:  2.9 mmHg    Systemic VTI:  0.09 m MV Mean grad:  1.0 mmHg  Systemic Diam: 2.50 cm MV Vmax:       0.85 m/s MV Vmean:      50.6 cm/s MV Decel Time: 108 msec MV E velocity: 64.87 cm/s Harold HedgeKenneth Yeraldine Forney MD Electronically signed by Harold HedgeKenneth Natarsha Hurwitz MD Signature Date/Time: 05/15/2019/12:46:18 PM    Final    US THORACENTESIS ASP PLEURAL SPACE W/IMG  GUIDE  Result Date: 05/15/2019 CLINICAL DATA:  Congestive heart failure and bilateral pleural effusions. EXAM: ULTRASOUND GUIDED RIGHT THORACENTESIS COMPARISON:  None. PROCEDURE: An ultrasound guided thoracentesis was thoroughly discussed with the patient and questions answered. The benefits, risks, alternatives and complications were also discussed. The patient understands and wishes to proceed with the procedure. Written consent was obtained. Ultrasound was performed to localize and estimated volumes of bilateral pleural effusions. The right was chosen for sampling. The right posterior chest wall was then prepped and draped in the normal sterile fashion. 1% Lidocaine was used for local anesthesia. Under ultrasound guidance a 6 French Safe-T-Centesis catheter was introduced. Thoracentesis was performed. The catheter was removed and a dressing applied. FINDINGS: Initial ultrasound shows bilateral pleural effusions, right greater than left. A total of approximately 700 mL of clear, yellow fluid was removed from the right pleural space. A fluid sample was sent for laboratory analysis. IMPRESSION: Successful ultrasound guided right thoracentesis yielding 700 mL of pleural fluid. Electronically Signed   By: Irish LackGlenn  Yamagata M.D.   On: 05/15/2019 13:17    Wt Readings from Last 3 Encounters:  05/15/19 77 kg  04/05/16 76.8 kg  02/23/16 77 kg    EKG: Initially sinus rhythm with no ischemia with current A. fib with variable ventricular response.  Physical Exam: Elderly Caucasian male in no acute distress with some shortness of breath. Blood pressure 112/82, pulse (!) 113, temperature 97.6 F (36.4 C), temperature source Oral, resp. rate 18, height 5\' 11"  (1.803 m), weight 77 kg, SpO2 95 %. Body mass index is 23.68 kg/m. General: Well developed, well nourished, in no acute distress. Head: Normocephalic, atraumatic, sclera non-icteric, no xanthomas, nares are without discharge.  Neck: Negative for carotid  bruits. JVD not elevated. Lungs: Decreased breath sounds bilaterally. Heart: Irregular regular rhythm. Abdomen: Soft, non-tender, non-distended with normoactive bowel sounds. No hepatomegaly. No rebound/guarding. No obvious abdominal masses. Msk:  Strength and tone appear normal for age. Extremities: No clubbing or cyanosis. No edema.  Distal pedal pulses are 2+ and equal bilaterally. Neuro: Alert and oriented X 3. No facial asymmetry. No focal deficit. Moves all extremities spontaneously. Psych:  Responds to questions appropriately with a normal affect.     Assessment and Plan  85 year old male with history of hypertension, hyperlipidemia, coronary artery disease and type 2 diabetes who was admitted on 05/14/2019 with worsening dyspnea and shortness of breath.  Initial EKG showed sinus rhythm.  He had bilateral pleural effusions.  Ruled out for myocardial infarction with unremarkable cardiac markers.  Chest CT revealed bilateral pleural effusions right greater than left.  Has improved slightly with thoracentesis of 700 cc from the right as well as gentle diuresis.  His A. fib rate has been somewhat variable on metoprolol and digoxin as needed.  He had an echocardiogram which revealed ejection fraction of 35 to 40%.  Previous ejection fractions are not substantiated at present.  1.  Atrial fibrillation   Currently with variable ventricular response.  On metoprolol at 25 mg 3 times daily.  Has received IV digoxin x2.  Will dose 1 more dose for rate sustained greater than 130 tonight.  We  will add 0.125 mg of digoxin to his regimen tomorrow morning and continue with metoprolol at 25 3 times daily following rate.  Will need to discuss chronic anticoagulation at discharge given his chads score of at least 5.  No obvious bleeding history.  Does have remaining left pleural effusion so will defer chronic or p.o. anticoagulation until determination of whether or not repeat thoracentesis is indicated.  2.   Cardiomyopathy   Ejection fraction 35 to 40% by echo.  Unclear whether this is previously been noted.  Likely multifactorial to include coronary disease, diabetes.  Will continue to follow heart rate.  Will use afterload reduction as blood pressure tolerates although pressure is somewhat soft.  Will discontinue lisinopril at present given soft blood pressure to allow more choices with regard to rate controlled.  Low-sodium diet is recommended.  Will need daily weights and close follow-up as an outpatient.  3.  Pleural effusions   Status post thoracentesis of 700 cc of clear yellow fluid from the right pleural space.  Still has a left pleural effusion.  May need this treated for therapeutic regions.  Will await laboratory follow-up of his pleural effusions.  Continue careful diuresis with IV furosemide following hemodynamics and renal function.  4.  Coronary artery disease   Anatomy date is currently not available however patient is nonischemic at present and has ruled out for myocardial infarction.  We will continue with aspirin and attempt to maintain a serum cholesterol LDL of less than 70.  Will continue with simvastatin.   5.  Hypertension   Had been on lisinopril and metoprolol.  Will hold the lisinopril for now due to soft blood pressure while we are treating his A. fib.  Will need to address medical regimen prior to discharge.  Signed, Dalia HeadingKenneth A Junko Ohagan MD 05/15/2019, 4:21 PM Pager: [phone]

## 2019-05-15 NOTE — Progress Notes (Signed)
Patient ID: Andrew Watts, male   DOB: 06/28/1934, 85 y.o.   MRN: [id number] [facility] PROGRESS NOTE  Victorious Kimery WPV:[id number] DOB: Sep 25, 1934 DOA: 05/14/2019 PCP: Noel Gerold, PA  HPI/Subjective: Patient with some shortness of breath.  Does not really feel his heart beating fast.  No history of heart failure previously  Objective: Vitals:   05/15/19 1233 05/15/19 1552  BP: 113/89 112/82  Pulse: (!) 124 (!) 113  Resp:  18  Temp:  97.6 F (36.4 C)  SpO2: 97% 95%    Filed Weights   05/14/19 1408 05/14/19 2355 05/15/19 0500  Weight: 76.8 kg 78.3 kg 77 kg    ROS: Review of Systems  Constitutional: Negative for chills and fever.  Eyes: Negative for blurred vision.  Respiratory: Positive for shortness of breath. Negative for cough.   Cardiovascular: Negative for chest pain.  Gastrointestinal: Negative for abdominal pain, constipation, diarrhea, nausea and vomiting.  Genitourinary: Negative for dysuria.  Musculoskeletal: Negative for joint pain.  Neurological: Negative for dizziness and headaches.   Exam: Physical Exam  Constitutional: He is oriented to person, place, and time.  HENT:  Nose: No mucosal edema.  Eyes: Conjunctivae and lids are normal.  Neck: Carotid bruit is not present.  Cardiovascular: S1 normal and S2 normal. An irregularly irregular rhythm present. Tachycardia present. Exam reveals no gallop.  No murmur heard. Respiratory: No respiratory distress. He has decreased breath sounds in the right middle field, the right lower field, the left middle field and the left lower field. He has no wheezes. He has rhonchi in the right lower field and the left lower field. He has no rales.  GI: Soft. Bowel sounds are normal. There is no abdominal tenderness.  Musculoskeletal:     Right ankle: Swelling present.     Left ankle: Swelling present.  Lymphadenopathy:    He has no cervical adenopathy.  Neurological: He is alert and oriented to person, place, and  time. No cranial nerve deficit.  Skin: Skin is warm. No rash noted. Nails show no clubbing.  Psychiatric: He has a normal mood and affect.      Data Reviewed: Basic Metabolic Panel: Recent Labs  Lab 05/14/19 1421 05/14/19 2146 05/15/19 0447  NA 136  --  139  K 4.2  --  3.7  CL 107  --  109  CO2 20*  --  20*  GLUCOSE 288*  --  225*  BUN 48*  --  40*  CREATININE 1.78*  --  1.68*  CALCIUM 9.2  --  8.9  MG  --  1.9  --    Liver Function Tests: Recent Labs  Lab 05/14/19 1421  AST 13*  ALT 16  ALKPHOS 69  BILITOT 0.8  PROT 7.2  ALBUMIN 3.8   CBC: Recent Labs  Lab 05/14/19 1421 05/15/19 0447  WBC 9.0 8.0  NEUTROABS 6.7 6.1  HGB 11.7* 11.0*  HCT 35.6* 33.3*  MCV 91.8 91.2  PLT 122* 127*   BNP (last 3 results) Recent Labs    05/14/19 1421  BNP 1,017.0*    CBG: Recent Labs  Lab 05/14/19 2153 05/15/19 0821 05/15/19 1253  GLUCAP 219* 199* 297*    Recent Results (from the past 240 hour(s))  SARS CORONAVIRUS 2 (TAT 6-24 HRS) Nasopharyngeal Nasopharyngeal Swab     Status: None   Collection Time: 05/14/19  7:24 PM   Specimen: Nasopharyngeal Swab  Result Value Ref Range Status   SARS Coronavirus 2 NEGATIVE NEGATIVE Final  Comment: (NOTE) SARS-CoV-2 target nucleic acids are NOT DETECTED. The SARS-CoV-2 RNA is generally detectable in upper and lower respiratory specimens during the acute phase of infection. Negative results do not preclude SARS-CoV-2 infection, do not rule out co-infections with other pathogens, and should not be used as the sole basis for treatment or other patient management decisions. Negative results must be combined with clinical observations, patient history, and epidemiological information. The expected result is Negative. Fact Sheet for Patients: SugarRoll.be Fact Sheet for Healthcare Providers: https://www.woods-mathews.com/ This test is not yet approved or cleared by the [place]  FDA and  has been authorized for detection and/or diagnosis of SARS-CoV-2 by FDA under an Emergency Use Authorization (EUA). This EUA will remain  in effect (meaning this test can be used) for the duration of the COVID-19 declaration under Section 56 4(b)(1) of the Act, 21 U.S.C. section 360bbb-3(b)(1), unless the authorization is terminated or revoked sooner. Performed at [facility], [place] [address] [place], [place] [address]      Studies: DG Chest 1 View  Result Date: 05/15/2019 CLINICAL DATA:  Post thoracentesis, 700 mL from the right side. EXAM: CHEST  1 VIEW COMPARISON:  05/14/2019 FINDINGS: Cardiomediastinal contours are normal. Lung volumes are slightly low. Hilar structures are unremarkable. Blunting of left costo diaphragmatic sulcus. Improved aeration at the right lung base following evacuation of pleural fluid in the right chest. No visible pneumothorax. No consolidation. Mild increased interstitial markings persist. Visualized skeletal structures are unremarkable. IMPRESSION: Persistent mild pulmonary edema, status post right thoracentesis without visible pneumothorax. Persistent left-sided pleural effusion and basilar atelectasis. Electronically Signed   By: Zetta Bills M.D.   On: 05/15/2019 12:54   DG Chest 2 View  Result Date: 05/14/2019 CLINICAL DATA:  Shortness of breath for 1 week EXAM: CHEST - 2 VIEW COMPARISON:  None. FINDINGS: The heart size and mediastinal contours are within normal limits. Calcific aortic knob. Prominent interstitial markings within both lungs. Streaky opacity within the right lung base. Trace bilateral pleural effusions. No pneumothorax. The visualized skeletal structures are unremarkable. IMPRESSION: 1. Prominent interstitial markings within both lungs which may reflect mild edema versus bronchitic type lung changes. 2. Streaky opacity within the right lung base which may reflect atelectasis versus infiltrate. 3. Trace bilateral pleural  effusions. Electronically Signed   By: Davina Poke D.O.   On: 05/14/2019 14:56   CT Angio Chest PE W and/or Wo Contrast  Result Date: 05/14/2019 CLINICAL DATA:  Shortness of breath. EXAM: CT ANGIOGRAPHY CHEST WITH CONTRAST TECHNIQUE: Multidetector CT imaging of the chest was performed using the standard protocol during bolus administration of intravenous contrast. Multiplanar CT image reconstructions and MIPs were obtained to evaluate the vascular anatomy. CONTRAST:  29mL OMNIPAQUE IOHEXOL 350 MG/ML SOLN COMPARISON:  None. FINDINGS: Cardiovascular: Contrast injection is sufficient to demonstrate satisfactory opacification of the pulmonary arteries to the segmental level. There is no pulmonary embolus. The main pulmonary artery is within normal limits for size. There is no CT evidence of acute right heart strain. There are thoracic aortic calcifications without evidence for an aneurysm. Heart size is enlarged. Coronary artery calcifications are noted. Mediastinum/Nodes: --there is a slightly prominent right paratracheal lymph node measuring approximately 1.2 cm (axial series 4, image 30). Additional small subcentimeter mediastinal and hilar lymph nodes are noted. --No axillary lymphadenopathy. --No supraclavicular lymphadenopathy. --there is a large complex right-sided thyroid nodule measuring approximately 3.4 x 2.9 cm (axial series 4, image 9). --The esophagus is unremarkable Lungs/Pleura: There are large  bilateral pleural effusions with adjacent atelectasis. There is interlobular septal thickening. There is no pneumothorax. No large focal infiltrate. The trachea is unremarkable aside from being shifted to the left by the patient's large right-sided thyroid nodule. Upper Abdomen: The partially visualized gallbladder is distended without definite CT evidence for acute cholecystitis. Musculoskeletal: No chest wall abnormality. No acute or significant osseous findings. Review of the MIP images confirms the  above findings. IMPRESSION: 1. No evidence for acute pulmonary embolus. 2. Large bilateral pleural effusions with adjacent atelectasis. 3. Cardiomegaly with interlobular septal thickening, suggestive of interstitial edema. 4. Mild mediastinal adenopathy, likely reactive. 5. Large complex right-sided thyroid nodule measuring approximately 3.4 cm. No follow-up recommended unless clinically warranted (ref: J Am Coll Radiol. 2015 Feb;12(2): 143-50). 6. Aortic Atherosclerosis (ICD10-I70.0). Electronically Signed   By: Katherine Mantle M.D.   On: 05/14/2019 19:48   ECHOCARDIOGRAM COMPLETE  Result Date: 05/15/2019    ECHOCARDIOGRAM REPORT   Patient Name:   Nadene Rubins Date of Exam: 05/15/2019 Medical Rec #:  [id number]     Height:       71.0 in Accession #:    [id number]    Weight:       169.8 lb Date of Birth:  1934-08-14     BSA:          1.97 m Patient Age:    84 years      BP:           112/83 mmHg Patient Gender: M             HR:           109 bpm. Exam Location:  [facility] Procedure: 2D Echo, Color Doppler, Cardiac Doppler and Intracardiac            Opacification Agent Indications:     R06.00 Dyspnea  History:         Patient has no prior history of Echocardiogram examinations.                  Previous Myocardial Infarction, CKD; Risk Factors:Diabetes and                  HCL.  Sonographer:     Humphrey Rolls RDCS (AE) Referring Phys:  [id number] Vernetta Honey MANSY Diagnosing Phys: Harold Hedge MD  Sonographer Comments: Suboptimal apical window and suboptimal subcostal window. IMPRESSIONS  1. Left ventricular ejection fraction, by estimation, is 35 to 40%. The left ventricle has normal function. The left ventricle has no regional wall motion abnormalities. The left ventricular internal cavity size was mildly dilated. Left ventricular diastolic parameters were normal.  2. Right ventricular systolic function is normal. The right ventricular size is mildly enlarged.  3. Left atrial size was mildly dilated.  4. Right atrial size  was mildly dilated.  5. The mitral valve is degenerative. Trivial mitral valve regurgitation.  6. The aortic valve is tricuspid. Aortic valve regurgitation is not visualized. FINDINGS  Left Ventricle: Left ventricular ejection fraction, by estimation, is 35 to 40%. The left ventricle has normal function. The left ventricle has no regional wall motion abnormalities. Definity contrast agent was given IV to delineate the left ventricular  endocardial borders. The left ventricular internal cavity size was mildly dilated. There is no left ventricular hypertrophy. Left ventricular diastolic parameters were normal. Right Ventricle: The right ventricular size is mildly enlarged. No increase in right ventricular wall thickness. Right ventricular systolic function is normal. Left Atrium: Left atrial size was mildly dilated.  Right Atrium: Right atrial size was mildly dilated. Pericardium: There is no evidence of pericardial effusion. Mitral Valve: The mitral valve is degenerative in appearance. Trivial mitral valve regurgitation. MV peak gradient, 2.9 mmHg. The mean mitral valve gradient is 1.0 mmHg. Tricuspid Valve: The tricuspid valve is normal in structure. Tricuspid valve regurgitation is trivial. Aortic Valve: The aortic valve is tricuspid. Aortic valve regurgitation is not visualized. Aortic valve mean gradient measures 1.0 mmHg. Aortic valve peak gradient measures 2.4 mmHg. Aortic valve area, by VTI measures 4.25 cm. Pulmonic Valve: The pulmonic valve was not well visualized. Pulmonic valve regurgitation is not visualized. Aorta: The aortic root is normal in size and structure. IAS/Shunts: No atrial level shunt detected by color flow Doppler.  LEFT VENTRICLE PLAX 2D LVIDd:         5.77 cm      Diastology LVIDs:         4.84 cm      LV e' lateral:   8.38 cm/s LV PW:         1.04 cm      LV E/e' lateral: 7.7 LV IVS:        0.82 cm      LV e' medial:    9.14 cm/s LVOT diam:     2.50 cm      LV E/e' medial:  7.1 LV SV:          45.50 ml LV SV Index:   27.94 LVOT Area:     4.91 cm  LV Volumes (MOD) LV vol d, MOD A2C: 122.0 ml LV vol d, MOD A4C: 131.0 ml LV vol s, MOD A2C: 72.5 ml LV vol s, MOD A4C: 81.8 ml LV SV MOD A2C:     49.5 ml LV SV MOD A4C:     131.0 ml LV SV MOD BP:      48.6 ml RIGHT VENTRICLE RV Basal diam:  3.02 cm LEFT ATRIUM             Index       RIGHT ATRIUM           Index LA diam:        4.40 cm 2.24 cm/m  RA Area:     19.30 cm LA Vol (A2C):   64.7 ml 32.90 ml/m RA Volume:   60.90 ml  30.97 ml/m LA Vol (A4C):   80.0 ml 40.68 ml/m LA Biplane Vol: 75.5 ml 38.39 ml/m  AORTIC VALVE                   PULMONIC VALVE AV Area (Vmax):    3.93 cm    PV Vmax:       0.92 m/s AV Area (Vmean):   3.63 cm    PV Vmean:      59.400 cm/s AV Area (VTI):     4.25 cm    PV VTI:        0.129 m AV Vmax:           77.60 cm/s  PV Peak grad:  3.4 mmHg AV Vmean:          51.800 cm/s PV Mean grad:  2.0 mmHg AV VTI:            0.107 m AV Peak Grad:      2.4 mmHg AV Mean Grad:      1.0 mmHg LVOT Vmax:         62.10 cm/s LVOT Vmean:  38.300 cm/s LVOT VTI:          0.093 m LVOT/AV VTI ratio: 0.87  AORTA Ao Root diam: 3.70 cm MITRAL VALVE MV Area (PHT): 7.02 cm    SHUNTS MV Peak grad:  2.9 mmHg    Systemic VTI:  0.09 m MV Mean grad:  1.0 mmHg    Systemic Diam: 2.50 cm MV Vmax:       0.85 m/s MV Vmean:      50.6 cm/s MV Decel Time: 108 msec MV E velocity: 64.87 cm/s Harold Hedge MD Electronically signed by Harold Hedge MD Signature Date/Time: 05/15/2019/12:46:18 PM    Final    US THORACENTESIS ASP PLEURAL SPACE W/IMG GUIDE  Result Date: 05/15/2019 CLINICAL DATA:  Congestive heart failure and bilateral pleural effusions. EXAM: ULTRASOUND GUIDED RIGHT THORACENTESIS COMPARISON:  None. PROCEDURE: An ultrasound guided thoracentesis was thoroughly discussed with the patient and questions answered. The benefits, risks, alternatives and complications were also discussed. The patient understands and wishes to proceed with the procedure.  Written consent was obtained. Ultrasound was performed to localize and estimated volumes of bilateral pleural effusions. The right was chosen for sampling. The right posterior chest wall was then prepped and draped in the normal sterile fashion. 1% Lidocaine was used for local anesthesia. Under ultrasound guidance a 6 French Safe-T-Centesis catheter was introduced. Thoracentesis was performed. The catheter was removed and a dressing applied. FINDINGS: Initial ultrasound shows bilateral pleural effusions, right greater than left. A total of approximately 700 mL of clear, yellow fluid was removed from the right pleural space. A fluid sample was sent for laboratory analysis. IMPRESSION: Successful ultrasound guided right thoracentesis yielding 700 mL of pleural fluid. Electronically Signed   By: Irish Lack M.D.   On: 05/15/2019 13:17    Scheduled Meds: . aspirin EC  81 mg Oral Daily  . digoxin  0.25 mg Intravenous Once  . [START ON 05/16/2019] enoxaparin (LOVENOX) injection  40 mg Subcutaneous Q24H  . finasteride  5 mg Oral Daily  . furosemide  40 mg Intravenous Q12H  . insulin aspart  0-9 Units Subcutaneous TID PC & HS  . lisinopril  10 mg Oral Daily  . metoprolol tartrate  25 mg Oral TID  . simvastatin  20 mg Oral Daily  . sodium chloride flush  3 mL Intravenous Q12H  . terazosin  2 mg Oral Daily   Continuous Infusions: . sodium chloride      Assessment/Plan: 1. Acute systolic congestive heart failure.  Patient on IV Lasix 40 mg IV twice daily.  On metoprolol 3 times a day and lisinopril.  Need to see where his kidney function goes prior to starting Aldactone.  We will get cardiology consultation. 2. Atrial fibrillation with rapid ventricular response.  Increase metoprolol to 25 mg 3 times daily.  We will give IV digoxin x1.  Patient likely a candidate for full anticoagulation but would like to see how he walks first. 3. Bilateral pleural effusions.  Thoracentesis on the right side removed  700 mL. 4. Chronic kidney disease stage IIIb.  Monitor closely with diuresis. 5. BPH on Terazosin and finasteride 6. Hyperlipidemia unspecified on simvastatin  Code Status:     Code Status Orders  (From admission, onward)         Start     Ordered   05/14/19 2006  Full code  Continuous     05/14/19 2008        Code Status History    This patient has  a current code status but no historical code status.   Advance Care Planning Activity    Advance Directive Documentation     Most Recent Value  Type of Advance Directive  Healthcare Power of Attorney  Pre-existing out of facility DNR order (yellow form or pink MOST form)  --  "MOST" Form in Place?  --     Family Communication: Left message for the patient's wife on the phone Disposition Plan: Need to control heart rate better and diuresed more prior to any disposition.  Consultants:  Cardiology consult  Time spent: 28 minutes  Erskin [employer]  [facility]

## 2019-05-15 NOTE — Progress Notes (Signed)
Made aware by ccmd patients heart rate just to 150 however unsustained. Upon entering room patient resting in bed, wife at bedside. No c/o pain, no distress, no palpitation and no sob. Patients heart rate continue to range from 110's to 130 with occasional 140

## 2019-05-15 NOTE — Progress Notes (Signed)
*  PRELIMINARY RESULTS* Echocardiogram 2D Echocardiogram has been performed.  Andrew Watts 05/15/2019, 11:15 AM

## 2019-05-16 LAB — PROTEIN, BODY FLUID (OTHER): Total Protein, Body Fluid Other: 1.7 g/dL

## 2019-05-16 LAB — BASIC METABOLIC PANEL
Anion gap: 9 (ref 5–15)
BUN: 40 mg/dL — ABNORMAL HIGH (ref 8–23)
CO2: 24 mmol/L (ref 22–32)
Calcium: 9 mg/dL (ref 8.9–10.3)
Chloride: 106 mmol/L (ref 98–111)
Creatinine, Ser: 1.61 mg/dL — ABNORMAL HIGH (ref 0.61–1.24)
GFR calc Af Amer: 45 mL/min — ABNORMAL LOW (ref >60)
GFR calc non Af Amer: 39 mL/min — ABNORMAL LOW (ref >60)
Glucose, Bld: 198 mg/dL — ABNORMAL HIGH (ref 70–99)
Potassium: 3.5 mmol/L (ref 3.5–5.1)
Sodium: 139 mmol/L (ref 135–145)

## 2019-05-16 LAB — GLUCOSE, CAPILLARY
Glucose-Capillary: 187 mg/dL — ABNORMAL HIGH (ref 70–99)
Glucose-Capillary: 241 mg/dL — ABNORMAL HIGH (ref 70–99)
Glucose-Capillary: 291 mg/dL — ABNORMAL HIGH (ref 70–99)
Glucose-Capillary: 173 mg/dL — ABNORMAL HIGH (ref 70–99)

## 2019-05-16 LAB — CYTOLOGY - NON PAP

## 2019-05-16 LAB — DIGOXIN LEVEL: Digoxin Level: 0.6 ng/mL — ABNORMAL LOW (ref 0.8–2.0)

## 2019-05-16 MED ORDER — METOPROLOL TARTRATE 5 MG/5ML IV SOLN
5.0000 mg | INTRAVENOUS | Status: DC | PRN
  Filled 2019-05-16: qty 5

## 2019-05-16 MED ORDER — ADULT MULTIVITAMIN W/MINERALS CH
1.0000 | ORAL_TABLET | Freq: Every day | ORAL | Status: DC
  Administered 2019-05-17: 09:00:00 1 via ORAL
  Filled 2019-05-16: qty 1

## 2019-05-16 MED ORDER — APIXABAN 2.5 MG PO TABS
2.5000 mg | ORAL_TABLET | Freq: Two times a day (BID) | ORAL | Status: DC
  Administered 2019-05-16 – 2019-05-17 (×3): 2.5 mg via ORAL
  Filled 2019-05-16 (×3): qty 1

## 2019-05-16 MED ORDER — METOPROLOL TARTRATE 50 MG PO TABS: 50.0000 mg | ORAL_TABLET | Freq: Two times a day (BID) | ORAL | Status: DC

## 2019-05-16 MED ORDER — NEPRO/CARBSTEADY PO LIQD
237.0000 mL | Freq: Two times a day (BID) | ORAL | Status: DC
  Administered 2019-05-16 – 2019-05-17 (×2): 237 mL via ORAL

## 2019-05-16 MED ORDER — METOPROLOL TARTRATE 50 MG PO TABS
50.0000 mg | ORAL_TABLET | Freq: Two times a day (BID) | ORAL | Status: DC
  Administered 2019-05-16 – 2019-05-17 (×3): 50 mg via ORAL
  Filled 2019-05-16 (×3): qty 1

## 2019-05-16 NOTE — Consult Note (Signed)
ANTICOAGULATION CONSULT NOTE   Pharmacy Consult for Apixaban Indication: atrial fibrillation  No Known Allergies  Patient Measurements: Height: 5\' 11"  (180.3 cm) Weight: 166 lb 4.8 oz (75.4 kg) IBW/kg (Calculated) : 75.3   Vital Signs: Temp: 97.8 F (36.6 C) (02/18 0739) Temp Source: Oral (02/18 0529) BP: 127/80 (02/18 0739) Pulse Rate: 83 (02/18 0739)  Labs: Recent Labs    05/14/19 1421 05/14/19 1651 05/15/19 0447 05/16/19 0520  HGB 11.7*  --  11.0*  --   HCT 35.6*  --  33.3*  --   PLT 122*  --  127*  --   CREATININE 1.78*  --  1.68* 1.61*  TROPONINIHS 27* 22*  --   --     Estimated Creatinine Clearance: 37.7 mL/min (A) (by C-G formula based on SCr of 1.61 mg/dL (H)).   Medical History: Past Medical History:  Diagnosis Date  . Chronic kidney disease    told 7-8 yrs ago. kidney function only 35%  . Diabetes mellitus without complication (HCC)   . Hypercholesteremia   . Myocardial infarction (HCC) 1986   x2  . Wears dentures    full upper and lower    Medications:  Medications Prior to Admission  Medication Sig Dispense Refill Last Dose  . amLODipine (NORVASC) 10 MG tablet Take 10 mg by mouth daily.   05/14/2019 at 0700  . aspirin 81 MG tablet Take 81 mg by mouth daily.   05/14/2019 at 0700  . finasteride (PROSCAR) 5 MG tablet Take 5 mg by mouth daily.   05/14/2019 at 0700  . glipiZIDE (GLUCOTROL) 5 MG tablet Take 5 mg by mouth 2 (two) times daily before a meal.   05/14/2019 at 0700  . hydrochlorothiazide (HYDRODIURIL) 25 MG tablet Take 25 mg by mouth daily.   05/14/2019 at 0700  . lisinopril (PRINIVIL,ZESTRIL) 10 MG tablet Take 10 mg by mouth daily.   05/14/2019 at 0700  . metoprolol tartrate (LOPRESSOR) 25 MG tablet Take 25 mg by mouth 2 (two) times daily.   05/14/2019 at 0700  . simvastatin (ZOCOR) 20 MG tablet Take 20 mg by mouth daily.   05/13/2019 at 1800  . terazosin (HYTRIN) 2 MG capsule Take 2 mg by mouth daily.   05/14/2019 at 0700   Scheduled:  .  apixaban  2.5 mg Oral BID  . digoxin  0.125 mg Oral Daily  . finasteride  5 mg Oral Daily  . furosemide  40 mg Intravenous Q12H  . insulin aspart  0-9 Units Subcutaneous TID PC & HS  . metoprolol tartrate  25 mg Oral TID  . simvastatin  20 mg Oral Daily  . sodium chloride flush  3 mL Intravenous Q12H  . terazosin  2 mg Oral Daily   Infusions:  . sodium chloride     PRN: sodium chloride, acetaminophen, digoxin, ondansetron (ZOFRAN) IV, sodium chloride flush, zolpidem Anti-infectives (From admission, onward)   None      Assessment: Pharmacy consulted to start apixaban for afib. Age > 80, Scr > 1.5. CHADSVASc 5.   Goal of Therapy:  Monitor platelets by anticoagulation protocol: Yes   Plan:  Pt meets criteria for dose reduction for apixaban. Will start apixaban 2.5 mg BID.   05/16/2019, PharmD, BCPS 05/16/2019,10:07 AM

## 2019-05-16 NOTE — Progress Notes (Signed)
PT Cancellation Note  Patient Details Name: Andrew Watts MRN: [id number] DOB: 09-05-1934   Cancelled Treatment:    Reason Eval/Treat Not Completed: Medical issues which prohibited therapy.  Chart reviewed.  Pt's HR noted to be fluctuating between 119-150 bpm on telemetry screen.  D/t elevated HR, will hold PT at this time (discussed with pt's nurse).  Will re-attempt PT treatment session at a later date/time.  Hendricks Limes, PT 05/16/19, 1:31 PM

## 2019-05-16 NOTE — Progress Notes (Signed)
Patient ID: Andrew Watts, male   DOB: Aug 05, 1934, 85 y.o.   MRN: [id number] [facility] PROGRESS NOTE  Andrew Watts DDU:[id number] DOB: Feb 01, 1935 DOA: 05/14/2019 PCP: Noel Gerold, PA  HPI/Subjective: Patient feeling better today with regards to his breathing.  He had a thoracentesis yesterday removing 750 mL underneath the right lung.  Since then he has been breathing better.  This morning he was still on oxygen.  Still has a little cough.  Some shortness of breath.  Heart rate still fast at times.  Objective: Vitals:   05/16/19 0529 05/16/19 0739  BP: (!) 130/98 127/80  Pulse: 98 83  Resp: 18 18  Temp: (!) 97.5 F (36.4 C) 97.8 F (36.6 C)  SpO2: 98% 99%    Filed Weights   05/14/19 2355 05/15/19 0500 05/16/19 0529  Weight: 78.3 kg 77 kg 75.4 kg    ROS: Review of Systems  Constitutional: Negative for chills and fever.  Eyes: Negative for blurred vision.  Respiratory: Positive for shortness of breath. Negative for cough.   Cardiovascular: Negative for chest pain.  Gastrointestinal: Negative for abdominal pain, constipation, diarrhea, nausea and vomiting.  Genitourinary: Negative for dysuria.  Musculoskeletal: Negative for joint pain.  Neurological: Negative for dizziness and headaches.   Exam: Physical Exam  Constitutional: He is oriented to person, place, and time.  HENT:  Nose: No mucosal edema.  Eyes: Conjunctivae and lids are normal.  Neck: Carotid bruit is not present.  Cardiovascular: S1 normal and S2 normal. An irregularly irregular rhythm present. Tachycardia present. Exam reveals no gallop.  No murmur heard. Respiratory: No respiratory distress. He has decreased breath sounds in the right lower field and the left lower field. He has no wheezes. He has rhonchi in the right lower field and the left lower field. He has no rales.  GI: Soft. Bowel sounds are normal. There is no abdominal tenderness.  Musculoskeletal:     Right ankle: Swelling present.      Left ankle: Swelling present.  Lymphadenopathy:    He has no cervical adenopathy.  Neurological: He is alert and oriented to person, place, and time. No cranial nerve deficit.  Skin: Skin is warm. No rash noted. Nails show no clubbing.  Psychiatric: He has a normal mood and affect.      Data Reviewed: Basic Metabolic Panel: Recent Labs  Lab 05/14/19 1421 05/14/19 2146 05/15/19 0447 05/16/19 0520  NA 136  --  139 139  K 4.2  --  3.7 3.5  CL 107  --  109 106  CO2 20*  --  20* 24  GLUCOSE 288*  --  225* 198*  BUN 48*  --  40* 40*  CREATININE 1.78*  --  1.68* 1.61*  CALCIUM 9.2  --  8.9 9.0  MG  --  1.9  --   --    Liver Function Tests: Recent Labs  Lab 05/14/19 1421  AST 13*  ALT 16  ALKPHOS 69  BILITOT 0.8  PROT 7.2  ALBUMIN 3.8   CBC: Recent Labs  Lab 05/14/19 1421 05/15/19 0447  WBC 9.0 8.0  NEUTROABS 6.7 6.1  HGB 11.7* 11.0*  HCT 35.6* 33.3*  MCV 91.8 91.2  PLT 122* 127*   BNP (last 3 results) Recent Labs    05/14/19 1421  BNP 1,017.0*    CBG: Recent Labs  Lab 05/15/19 1253 05/15/19 1634 05/15/19 2200 05/16/19 0741 05/16/19 1142  GLUCAP 297* 287* 269* 187* 241*    Recent Results (from the past  240 hour(s))  SARS CORONAVIRUS 2 (TAT 6-24 HRS) Nasopharyngeal Nasopharyngeal Swab     Status: None   Collection Time: 05/14/19  7:24 PM   Specimen: Nasopharyngeal Swab  Result Value Ref Range Status   SARS Coronavirus 2 NEGATIVE NEGATIVE Final    Comment: (NOTE) SARS-CoV-2 target nucleic acids are NOT DETECTED. The SARS-CoV-2 RNA is generally detectable in upper and lower respiratory specimens during the acute phase of infection. Negative results do not preclude SARS-CoV-2 infection, do not rule out co-infections with other pathogens, and should not be used as the sole basis for treatment or other patient management decisions. Negative results must be combined with clinical observations, patient history, and epidemiological information. The  expected result is Negative. Fact Sheet for Patients: HairSlick.no Fact Sheet for Healthcare Providers: quierodirigir.com This test is not yet approved or cleared by the [place] FDA and  has been authorized for detection and/or diagnosis of SARS-CoV-2 by FDA under an Emergency Use Authorization (EUA). This EUA will remain  in effect (meaning this test can be used) for the duration of the COVID-19 declaration under Section 56 4(b)(1) of the Act, 21 U.S.C. section 360bbb-3(b)(1), unless the authorization is terminated or revoked sooner. Performed at [facility], [address] [place], [place] [address]   Body fluid culture     Status: None (Preliminary result)   Collection Time: 05/15/19 12:23 PM   Specimen: PATH Cytology Pleural fluid  Result Value Ref Range Status   Specimen Description   Final    PLEURAL Performed at [facility], [address] [place], [place] [address]    Special Requests   Final    PLEURAL Performed at [facility], [address] [place], [place] [address]    Gram Stain   Final    FEW WBC PRESENT, PREDOMINANTLY PMN NO ORGANISMS SEEN    Culture   Final    NO GROWTH < 24 HOURS Performed at [facility], [address] [place], [place] [address]    Report Status PENDING  Incomplete     Studies: DG Chest 1 View  Result Date: 05/15/2019 CLINICAL DATA:  Post thoracentesis, 700 mL from the right side. EXAM: CHEST  1 VIEW COMPARISON:  05/14/2019 FINDINGS: Cardiomediastinal contours are normal. Lung volumes are slightly low. Hilar structures are unremarkable. Blunting of left costo diaphragmatic sulcus. Improved aeration at the right lung base following evacuation of pleural fluid in the right chest. No visible pneumothorax. No consolidation. Mild increased interstitial markings persist. Visualized skeletal structures are unremarkable. IMPRESSION: Persistent mild  pulmonary edema, status post right thoracentesis without visible pneumothorax. Persistent left-sided pleural effusion and basilar atelectasis. Electronically Signed   By: Donzetta Kohut M.D.   On: 05/15/2019 12:54   DG Chest 2 View  Result Date: 05/14/2019 CLINICAL DATA:  Shortness of breath for 1 week EXAM: CHEST - 2 VIEW COMPARISON:  None. FINDINGS: The heart size and mediastinal contours are within normal limits. Calcific aortic knob. Prominent interstitial markings within both lungs. Streaky opacity within the right lung base. Trace bilateral pleural effusions. No pneumothorax. The visualized skeletal structures are unremarkable. IMPRESSION: 1. Prominent interstitial markings within both lungs which may reflect mild edema versus bronchitic type lung changes. 2. Streaky opacity within the right lung base which may reflect atelectasis versus infiltrate. 3. Trace bilateral pleural effusions. Electronically Signed   By: Duanne Guess D.O.   On: 05/14/2019 14:56   CT Angio Chest PE W and/or Wo Contrast  Result Date: 05/14/2019 CLINICAL DATA:  Shortness of breath. EXAM: CT ANGIOGRAPHY CHEST WITH CONTRAST TECHNIQUE: Multidetector CT imaging of the chest was performed using the standard protocol during bolus administration of intravenous contrast. Multiplanar CT image reconstructions and MIPs were obtained to evaluate the vascular anatomy. CONTRAST:  51mL OMNIPAQUE IOHEXOL 350 MG/ML SOLN COMPARISON:  None. FINDINGS: Cardiovascular: Contrast injection is sufficient to demonstrate satisfactory opacification of the pulmonary arteries to the segmental level. There is no pulmonary embolus. The main pulmonary artery is within normal limits for size. There is no CT evidence of acute right heart strain. There are thoracic aortic calcifications without evidence for an aneurysm. Heart size is enlarged. Coronary artery calcifications are noted. Mediastinum/Nodes: --there is a slightly prominent right paratracheal lymph  node measuring approximately 1.2 cm (axial series 4, image 30). Additional small subcentimeter mediastinal and hilar lymph nodes are noted. --No axillary lymphadenopathy. --No supraclavicular lymphadenopathy. --there is a large complex right-sided thyroid nodule measuring approximately 3.4 x 2.9 cm (axial series 4, image 9). --The esophagus is unremarkable Lungs/Pleura: There are large bilateral pleural effusions with adjacent atelectasis. There is interlobular septal thickening. There is no pneumothorax. No large focal infiltrate. The trachea is unremarkable aside from being shifted to the left by the patient's large right-sided thyroid nodule. Upper Abdomen: The partially visualized gallbladder is distended without definite CT evidence for acute cholecystitis. Musculoskeletal: No chest wall abnormality. No acute or significant osseous findings. Review of the MIP images confirms the above findings. IMPRESSION: 1. No evidence for acute pulmonary embolus. 2. Large bilateral pleural effusions with adjacent atelectasis. 3. Cardiomegaly with interlobular septal thickening, suggestive of interstitial edema. 4. Mild mediastinal adenopathy, likely reactive. 5. Large complex right-sided thyroid nodule measuring approximately 3.4 cm. No follow-up recommended unless clinically warranted (ref: J Am Coll Radiol. 2015 Feb;12(2): 143-50). 6. Aortic Atherosclerosis (ICD10-I70.0). Electronically Signed   By: Constance Holster M.D.   On: 05/14/2019 19:48   ECHOCARDIOGRAM COMPLETE  Result Date: 05/15/2019    ECHOCARDIOGRAM REPORT   Patient Name:   Franki Cabot Date of Exam: 05/15/2019 Medical Rec #:  [id number]     Height:       71.0 in Accession #:    [id number]    Weight:       169.8 lb Date of Birth:  June 02, 1934     BSA:          1.97 m Patient Age:    32 years      BP:           112/83 mmHg Patient Gender: M             HR:           109 bpm. Exam Location:  [facility] Procedure: 2D Echo, Color Doppler, Cardiac Doppler and  Intracardiac            Opacification Agent Indications:     R06.00 Dyspnea  History:         Patient has no prior history of Echocardiogram examinations.                  Previous Myocardial Infarction, CKD; Risk Factors:Diabetes and                  HCL.  Sonographer:     Charmayne Sheer RDCS (AE) Referring Phys:  [id number] Arvella Merles Denison Diagnosing Phys: Bartholome Bill MD  Sonographer Comments: Suboptimal apical window and suboptimal subcostal window. IMPRESSIONS  1. Left ventricular ejection fraction, by estimation, is  35 to 40%. The left ventricle has normal function. The left ventricle has no regional wall motion abnormalities. The left ventricular internal cavity size was mildly dilated. Left ventricular diastolic parameters were normal.  2. Right ventricular systolic function is normal. The right ventricular size is mildly enlarged.  3. Left atrial size was mildly dilated.  4. Right atrial size was mildly dilated.  5. The mitral valve is degenerative. Trivial mitral valve regurgitation.  6. The aortic valve is tricuspid. Aortic valve regurgitation is not visualized. FINDINGS  Left Ventricle: Left ventricular ejection fraction, by estimation, is 35 to 40%. The left ventricle has normal function. The left ventricle has no regional wall motion abnormalities. Definity contrast agent was given IV to delineate the left ventricular  endocardial borders. The left ventricular internal cavity size was mildly dilated. There is no left ventricular hypertrophy. Left ventricular diastolic parameters were normal. Right Ventricle: The right ventricular size is mildly enlarged. No increase in right ventricular wall thickness. Right ventricular systolic function is normal. Left Atrium: Left atrial size was mildly dilated. Right Atrium: Right atrial size was mildly dilated. Pericardium: There is no evidence of pericardial effusion. Mitral Valve: The mitral valve is degenerative in appearance. Trivial mitral valve regurgitation. MV peak  gradient, 2.9 mmHg. The mean mitral valve gradient is 1.0 mmHg. Tricuspid Valve: The tricuspid valve is normal in structure. Tricuspid valve regurgitation is trivial. Aortic Valve: The aortic valve is tricuspid. Aortic valve regurgitation is not visualized. Aortic valve mean gradient measures 1.0 mmHg. Aortic valve peak gradient measures 2.4 mmHg. Aortic valve area, by VTI measures 4.25 cm. Pulmonic Valve: The pulmonic valve was not well visualized. Pulmonic valve regurgitation is not visualized. Aorta: The aortic root is normal in size and structure. IAS/Shunts: No atrial level shunt detected by color flow Doppler.  LEFT VENTRICLE PLAX 2D LVIDd:         5.77 cm      Diastology LVIDs:         4.84 cm      LV e' lateral:   8.38 cm/s LV PW:         1.04 cm      LV E/e' lateral: 7.7 LV IVS:        0.82 cm      LV e' medial:    9.14 cm/s LVOT diam:     2.50 cm      LV E/e' medial:  7.1 LV SV:         45.50 ml LV SV Index:   27.94 LVOT Area:     4.91 cm  LV Volumes (MOD) LV vol d, MOD A2C: 122.0 ml LV vol d, MOD A4C: 131.0 ml LV vol s, MOD A2C: 72.5 ml LV vol s, MOD A4C: 81.8 ml LV SV MOD A2C:     49.5 ml LV SV MOD A4C:     131.0 ml LV SV MOD BP:      48.6 ml RIGHT VENTRICLE RV Basal diam:  3.02 cm LEFT ATRIUM             Index       RIGHT ATRIUM           Index LA diam:        4.40 cm 2.24 cm/m  RA Area:     19.30 cm LA Vol (A2C):   64.7 ml 32.90 ml/m RA Volume:   60.90 ml  30.97 ml/m LA Vol (A4C):   80.0 ml 40.68 ml/m LA Biplane Vol:  75.5 ml 38.39 ml/m  AORTIC VALVE                   PULMONIC VALVE AV Area (Vmax):    3.93 cm    PV Vmax:       0.92 m/s AV Area (Vmean):   3.63 cm    PV Vmean:      59.400 cm/s AV Area (VTI):     4.25 cm    PV VTI:        0.129 m AV Vmax:           77.60 cm/s  PV Peak grad:  3.4 mmHg AV Vmean:          51.800 cm/s PV Mean grad:  2.0 mmHg AV VTI:            0.107 m AV Peak Grad:      2.4 mmHg AV Mean Grad:      1.0 mmHg LVOT Vmax:         62.10 cm/s LVOT Vmean:        38.300 cm/s  LVOT VTI:          0.093 m LVOT/AV VTI ratio: 0.87  AORTA Ao Root diam: 3.70 cm MITRAL VALVE MV Area (PHT): 7.02 cm    SHUNTS MV Peak grad:  2.9 mmHg    Systemic VTI:  0.09 m MV Mean grad:  1.0 mmHg    Systemic Diam: 2.50 cm MV Vmax:       0.85 m/s MV Vmean:      50.6 cm/s MV Decel Time: 108 msec MV E velocity: 64.87 cm/s Harold HedgeKenneth Fath MD Electronically signed by Harold HedgeKenneth Fath MD Signature Date/Time: 05/15/2019/12:46:18 PM    Final    US THORACENTESIS ASP PLEURAL SPACE W/IMG GUIDE  Result Date: 05/15/2019 CLINICAL DATA:  Congestive heart failure and bilateral pleural effusions. EXAM: ULTRASOUND GUIDED RIGHT THORACENTESIS COMPARISON:  None. PROCEDURE: An ultrasound guided thoracentesis was thoroughly discussed with the patient and questions answered. The benefits, risks, alternatives and complications were also discussed. The patient understands and wishes to proceed with the procedure. Written consent was obtained. Ultrasound was performed to localize and estimated volumes of bilateral pleural effusions. The right was chosen for sampling. The right posterior chest wall was then prepped and draped in the normal sterile fashion. 1% Lidocaine was used for local anesthesia. Under ultrasound guidance a 6 French Safe-T-Centesis catheter was introduced. Thoracentesis was performed. The catheter was removed and a dressing applied. FINDINGS: Initial ultrasound shows bilateral pleural effusions, right greater than left. A total of approximately 700 mL of clear, yellow fluid was removed from the right pleural space. A fluid sample was sent for laboratory analysis. IMPRESSION: Successful ultrasound guided right thoracentesis yielding 700 mL of pleural fluid. Electronically Signed   By: Irish LackGlenn  Yamagata M.D.   On: 05/15/2019 13:17    Scheduled Meds: . apixaban  2.5 mg Oral BID  . digoxin  0.125 mg Oral Daily  . feeding supplement (NEPRO CARB STEADY)  237 mL Oral BID BM  . finasteride  5 mg Oral Daily  . furosemide  40  mg Intravenous Q12H  . insulin aspart  0-9 Units Subcutaneous TID PC & HS  . metoprolol tartrate  50 mg Oral BID  . [START ON 05/17/2019] multivitamin with minerals  1 tablet Oral Daily  . simvastatin  20 mg Oral Daily  . sodium chloride flush  3 mL Intravenous Q12H  . terazosin  2 mg Oral Daily  Continuous Infusions: . sodium chloride      Assessment/Plan: 1. Acute systolic congestive heart failure.  Continue Lasix 40 mg IV twice daily.  Increase metoprolol to 50 mg twice a day.  Try to taper off oxygen and check pulse ox on room air with ambulation.  Cardiology held lisinopril but likely patient can go back on this. 2. Atrial fibrillation with rapid ventricular response.  Increase metoprolol to 50 mg twice a day.  Patient on oral digoxin.  Started Eliquis to prevent stroke. 3. Bilateral pleural effusions.  Thoracentesis on the right side removed 700 mL.  Since patient breathing better we will hold off on thoracentesis on the left side at this point. 4. Chronic kidney disease stage IIIb.  Monitor closely with diuresis. 5. BPH on Terazosin and finasteride 6. Hyperlipidemia unspecified on simvastatin  Code Status:     Code Status Orders  (From admission, onward)         Start     Ordered   05/14/19 2006  Full code  Continuous     05/14/19 2008        Code Status History    This patient has a current code status but no historical code status.   Advance Care Planning Activity    Advance Directive Documentation     Most Recent Value  Type of Advance Directive  Healthcare Power of Attorney  Pre-existing out of facility DNR order (yellow form or pink MOST form)  --  "MOST" Form in Place?  --     Family Communication: Spoke with wife at the bedside Disposition Plan: Still titrating meds for rapid atrial fibrillation.  Hopefully heart rate will improve and could go home in the next day or so.  Consultants:  Cardiology consult  Time spent: 27 minutes  Ilhan Debenedetto  [employer]

## 2019-05-16 NOTE — Progress Notes (Signed)
Inpatient Diabetes Program Recommendations  AACE/ADA: New Consensus Statement on Inpatient Glycemic Control    Target Ranges:  Prepandial:   less than 140 mg/dL      Peak postprandial:   less than 180 mg/dL (1-2 hours)      Critically ill patients:  140 - 180 mg/dL   Results for Andrew Watts, Andrew Watts (MRN [id number]) as of 05/16/2019 13:17  Ref. Range 05/15/2019 08:21 05/15/2019 12:53 05/15/2019 16:34 05/15/2019 22:00 05/16/2019 07:41 05/16/2019 11:42  Glucose-Capillary Latest Ref Range: 70 - 99 mg/dL 096 (H) 283 (H) 662 (H) 269 (H) 187 (H) 241 (H)  Results for Andrew Watts, Andrew Watts (MRN [id number]) as of 05/16/2019 13:17  Ref. Range 05/14/2019 21:46  Hemoglobin A1C Latest Ref Range: 4.8 - 5.6 % 11.3 (H)   Review of Glycemic Control  Diabetes history: DM2 Outpatient Diabetes medications: Glucotrol 5 mg BID (not taken in a while since taken off it due to kidney function) Current orders for Inpatient glycemic control: Novolog 0-9 units AC&HS  Inpatient Diabetes Program Recommendations:    Oral DM medication: May want to consider ordering Tradjenta 5 mg daily as an inpatient (low risk of hypoglycemia and eliminated via feces) and at discharge.   NOTE: Inpatient Diabetes Coordinator spoke with patient and his wife on 05/15/19 and patient's wife reported that patient was taken off Glucotrol some time ago due to kidney function.  Patient's wife states she will start checking glucose at home again and patient will plan to follow up with PCP.  Thanks, Orlando Penner, RN, MSN, CDE Diabetes Coordinator Inpatient Diabetes Program [phone] (Team Pager from 8am to 5pm)

## 2019-05-16 NOTE — Progress Notes (Signed)
Initial Nutrition Assessment  DOCUMENTATION CODES:   Not applicable  INTERVENTION:   Nepro Shake po BID, each supplement provides 425 kcal and 19 grams protein  MVI daily   Carbohydrate controlled/3g sodium diet   NUTRITION DIAGNOSIS:   Inadequate oral intake related to acute illness as evidenced by per patient/family report.  GOAL:   Patient will meet greater than or equal to 90% of their needs  MONITOR:   PO intake, Supplement acceptance, Labs, Weight trends, Skin, I & O's  REASON FOR ASSESSMENT:   Consult Assessment of nutrition requirement/status  ASSESSMENT:   85 y.o. pleasant Caucasian male with a known history of hypertension, dyslipidemia, coronary artery disease and type 2 diabetes mellitus who is admitted with CHF   Unable to reach pt by phone. Per chart review, pt with poor appetite and oral intake for 1 month pta. RD will add supplements and vitamins to help pt meet his estimated needs. RD will also change pt over to a carbohydrate modified/3g sodium diet. There is no recent weight history in chart, but pt does appear to be weight stable from his documented weights in 2018.   Medications reviewed and include: lasix, insulin  Labs reviewed: BUN 40(H), creat 1.61(H)  BNP- 1017(H)- 2/16 cbgs- 199, 297, 287, 269, 187 x 24 hrs AIC 11.3(H)  Unable to complete Nutrition-Focused physical exam at this time.   Diet Order:   Diet Order            Diet 2 gram sodium Room service appropriate? Yes; Fluid consistency: Thin  Diet effective now             EDUCATION NEEDS:   No education needs have been identified at this time  Skin:  Skin Assessment: Reviewed RN Assessment(Pressure injury R toe, incision flank)  Last BM:  2/17- TYPE 4  Height:   Ht Readings from Last 1 Encounters:  05/14/19 5\' 11"  (1.803 m)    Weight:   Wt Readings from Last 1 Encounters:  05/16/19 75.4 kg    Ideal Body Weight:  78.1 kg  BMI:  Body mass index is 23.19  kg/m.  Estimated Nutritional Needs:   Kcal:  1800-2100kcal/day  Protein:  90-105g/day  Fluid:  2L/day  05/18/19 MS, RD, LDN Contact information available in Amion

## 2019-05-16 NOTE — Care Management Important Message (Signed)
Important Message  Patient Details  Name: Andrew Watts MRN: [id number] Date of Birth: 11-03-1934   Medicare Important Message Given:  Yes     Johnell Comings 05/16/2019, 1:40 PM

## 2019-05-16 NOTE — TOC Initial Note (Signed)
Transition of Care [facility]) - Initial/Assessment Note    Patient Details  Name: Andrew Watts MRN: [id number] Date of Birth: Oct 09, 1934  Transition of Care [facility]) CM/SW Contact:    Victorino Dike, RN Phone Number: 05/16/2019, 10:54 AM  Clinical Narrative:                   Met with patient and spouse.  They live at home and are independent.  Patient reports having a scale and BP cuff.  Discussed the need to weigh daily and check blood pressure.  Educated on keeping a log of information and when to call the PCP.  No further TOC needs at this time, please re-consult for new needs.   Expected Discharge Plan: Home/Self Care Barriers to Discharge: Continued Medical Work up   Patient Goals and CMS Choice        Expected Discharge Plan and Services Expected Discharge Plan: Home/Self Care                                              Prior Living Arrangements/Services     Patient language and need for interpreter reviewed:: Yes        Need for Family Participation in Patient Care: No (Comment) Care giver support system in place?: Yes (comment)   Criminal Activity/Legal Involvement Pertinent to Current Situation/Hospitalization: No - Comment as needed  Activities of Daily Living Home Assistive Devices/Equipment: Eyeglasses, Dentures (specify type) ADL Screening (condition at time of admission) Patient's cognitive ability adequate to safely complete daily activities?: Yes Is the patient deaf or have difficulty hearing?: No Does the patient have difficulty seeing, even when wearing glasses/contacts?: No Does the patient have difficulty concentrating, remembering, or making decisions?: No Patient able to express need for assistance with ADLs?: Yes Does the patient have difficulty dressing or bathing?: No Independently performs ADLs?: Yes (appropriate for developmental age) Does the patient have difficulty walking or climbing stairs?: Yes Weakness of Legs:  Both Weakness of Arms/Hands: None  Permission Sought/Granted                  Emotional Assessment Appearance:: Appears younger than stated age Attitude/Demeanor/Rapport: Engaged, Gracious Affect (typically observed): Appropriate, Accepting, Happy Orientation: : Oriented to Self, Oriented to Place, Oriented to  Time, Oriented to Situation Alcohol / Substance Use: Not Applicable Psych Involvement: No (comment)  Admission diagnosis:  Shortness of breath [R06.02] Acute CHF (congestive heart failure) ([place]) [I50.9] Patient Active Problem List   Diagnosis Date Noted  . Acute systolic CHF (congestive heart failure) ([place])   . Atrial fibrillation with RVR ([place])   . Pleural effusion   . Chronic renal failure, stage 3b   . Benign prostatic hyperplasia   . Hyperlipidemia   . Acute CHF (congestive heart failure) ([place]) 05/14/2019   PCP:  Lucille Passy, PA Pharmacy:  No Pharmacies Listed    Social Determinants of Health (SDOH) Interventions    Readmission Risk Interventions No flowsheet data found.

## 2019-05-16 NOTE — Progress Notes (Signed)
PT Cancellation Note  Patient Details Name: Andrew Watts MRN: [id number] DOB: Dec 04, 1934   Cancelled Treatment:    Reason Eval/Treat Not Completed: Medical issues which prohibited therapy.  Pt's HR noted to be fluctuating between 104-146 bpm on telemetry screen.  D/t fluctuating elevated HR, will hold PT at this time (discussed with pt's nurse).  Will re-attempt PT treatment session at a later date/time.  Hendricks Limes, PT 05/16/19, 3:45 PM

## 2019-05-17 DIAGNOSIS — N183 Chronic kidney disease, stage 3 unspecified: Secondary | ICD-10-CM

## 2019-05-17 DIAGNOSIS — E1169 Type 2 diabetes mellitus with other specified complication: Secondary | ICD-10-CM

## 2019-05-17 DIAGNOSIS — E1122 Type 2 diabetes mellitus with diabetic chronic kidney disease: Secondary | ICD-10-CM

## 2019-05-17 LAB — BASIC METABOLIC PANEL
Anion gap: 11 (ref 5–15)
BUN: 47 mg/dL — ABNORMAL HIGH (ref 8–23)
CO2: 25 mmol/L (ref 22–32)
Calcium: 9.2 mg/dL (ref 8.9–10.3)
Chloride: 101 mmol/L (ref 98–111)
Creatinine, Ser: 1.95 mg/dL — ABNORMAL HIGH (ref 0.61–1.24)
GFR calc Af Amer: 36 mL/min — ABNORMAL LOW (ref >60)
GFR calc non Af Amer: 31 mL/min — ABNORMAL LOW (ref >60)
Glucose, Bld: 207 mg/dL — ABNORMAL HIGH (ref 70–99)
Potassium: 3.7 mmol/L (ref 3.5–5.1)
Sodium: 137 mmol/L (ref 135–145)

## 2019-05-17 LAB — GLUCOSE, CAPILLARY
Glucose-Capillary: 211 mg/dL — ABNORMAL HIGH (ref 70–99)
Glucose-Capillary: 237 mg/dL — ABNORMAL HIGH (ref 70–99)

## 2019-05-17 LAB — DIGOXIN LEVEL: Digoxin Level: 0.7 ng/mL — ABNORMAL LOW (ref 0.8–2.0)

## 2019-05-17 MED ORDER — LINAGLIPTIN 5 MG PO TABS: 5.0000 mg | ORAL_TABLET | Freq: Every day | ORAL | 0 refills | Status: DC

## 2019-05-17 MED ORDER — FUROSEMIDE 20 MG PO TABS: 40.0000 mg | ORAL_TABLET | Freq: Every day | ORAL | 0 refills | Status: DC

## 2019-05-17 MED ORDER — METOPROLOL TARTRATE 50 MG PO TABS
50.0000 mg | ORAL_TABLET | Freq: Once | ORAL | Status: AC
  Administered 2019-05-17: 50 mg via ORAL
  Filled 2019-05-17: qty 1

## 2019-05-17 MED ORDER — MAGNESIUM SULFATE 2 GM/50ML IV SOLN
2.0000 g | Freq: Once | INTRAVENOUS | Status: AC
  Administered 2019-05-17: 11:00:00 2 g via INTRAVENOUS
  Filled 2019-05-17: qty 50

## 2019-05-17 MED ORDER — NEPRO/CARBSTEADY PO LIQD: 237.0000 mL | Freq: Two times a day (BID) | ORAL | 0 refills | Status: AC

## 2019-05-17 MED ORDER — METOPROLOL TARTRATE 50 MG PO TABS: 100.0000 mg | ORAL_TABLET | Freq: Two times a day (BID) | ORAL | Status: DC

## 2019-05-17 MED ORDER — AMIODARONE HCL 200 MG PO TABS
200.0000 mg | ORAL_TABLET | Freq: Two times a day (BID) | ORAL | Status: DC
  Administered 2019-05-17: 11:00:00 200 mg via ORAL
  Filled 2019-05-17: qty 1

## 2019-05-17 MED ORDER — APIXABAN 2.5 MG PO TABS: 2.5000 mg | ORAL_TABLET | Freq: Two times a day (BID) | ORAL | 0 refills | Status: AC

## 2019-05-17 MED ORDER — METOPROLOL TARTRATE 100 MG PO TABS: 100.0000 mg | ORAL_TABLET | Freq: Two times a day (BID) | ORAL | 0 refills | Status: DC

## 2019-05-17 MED ORDER — DILTIAZEM HCL ER COATED BEADS 180 MG PO CP24
180.0000 mg | ORAL_CAPSULE | Freq: Every day | ORAL | Status: DC
  Administered 2019-05-17: 09:00:00 180 mg via ORAL
  Filled 2019-05-17: qty 1

## 2019-05-17 MED ORDER — AMIODARONE HCL 200 MG PO TABS: ORAL_TABLET | ORAL | 0 refills | Status: DC

## 2019-05-17 MED ORDER — POTASSIUM CHLORIDE CRYS ER 20 MEQ PO TBCR
20.0000 meq | EXTENDED_RELEASE_TABLET | Freq: Once | ORAL | Status: AC
  Administered 2019-05-17: 20 meq via ORAL
  Filled 2019-05-17: qty 1

## 2019-05-17 NOTE — Plan of Care (Signed)
Nutrition Education Note  RD consulted for nutrition education regarding new onset CHF.  RD provided "Low Sodium Nutrition Therapy" handout from the Academy of [employer]etics. Reviewed patient's dietary recall. Provided examples on ways to decrease sodium intake in diet. Discouraged intake of processed foods and use of salt shaker. Encouraged fresh fruits and vegetables as well as whole grain sources of carbohydrates to maximize fiber intake.   RD discussed why it is important for patient to adhere to diet recommendations, and emphasized the role of fluids, foods to avoid, and importance of weighing self daily. Teach back method used.  Expect good compliance.  Body mass index is 24.45 kg/m. Pt meets criteria for normal weight based on current BMI.  RD following this patient   Betsey Holiday MS, RD, LDN Contact information available in Amion

## 2019-05-17 NOTE — Progress Notes (Signed)
Rate somewhat improved but still a little rapid at times. Will continue with current dose of amiodarone at 200 bid.  Will hold on ace I with worsening renal funciton. Continue with apixiban at 2.5 bid.

## 2019-05-17 NOTE — Plan of Care (Signed)
  Problem: Education: Goal: Knowledge of General Education information will improve Description: Including pain rating scale, medication(s)/side effects and non-pharmacologic comfort measures 05/17/2019 1346 by Jerene Dilling, RN Outcome: Adequate for Discharge 05/17/2019 1346 by Jerene Dilling, RN Outcome: Adequate for Discharge   Problem: Health Behavior/Discharge Planning: Goal: Ability to manage health-related needs will improve 05/17/2019 1346 by Jerene Dilling, RN Outcome: Adequate for Discharge 05/17/2019 1346 by Jerene Dilling, RN Outcome: Adequate for Discharge   Problem: Clinical Measurements: Goal: Ability to maintain clinical measurements within normal limits will improve 05/17/2019 1346 by Edison Pace D, RN Outcome: Adequate for Discharge 05/17/2019 1346 by Jerene Dilling, RN Outcome: Adequate for Discharge Goal: Will remain free from infection 05/17/2019 1346 by Edison Pace D, RN Outcome: Adequate for Discharge 05/17/2019 1346 by Jerene Dilling, RN Outcome: Adequate for Discharge Goal: Diagnostic test results will improve 05/17/2019 1346 by Jerene Dilling, RN Outcome: Adequate for Discharge 05/17/2019 1346 by Jerene Dilling, RN Outcome: Adequate for Discharge Goal: Respiratory complications will improve 05/17/2019 1346 by Jerene Dilling, RN Outcome: Adequate for Discharge 05/17/2019 1346 by Jerene Dilling, RN Outcome: Adequate for Discharge Goal: Cardiovascular complication will be avoided 05/17/2019 1346 by Jerene Dilling, RN Outcome: Adequate for Discharge 05/17/2019 1346 by Jerene Dilling, RN Outcome: Adequate for Discharge   Problem: Coping: Goal: Level of anxiety will decrease 05/17/2019 1346 by Jerene Dilling, RN Outcome: Adequate for Discharge 05/17/2019 1346 by Jerene Dilling, RN Outcome: Adequate for Discharge   Problem: Elimination: Goal: Will not experience complications related to urinary retention 05/17/2019 1346 by  Jerene Dilling, RN Outcome: Adequate for Discharge 05/17/2019 1346 by Jerene Dilling, RN Outcome: Adequate for Discharge   Problem: Education: Goal: Ability to verbalize understanding of medication therapies will improve 05/17/2019 1346 by Jerene Dilling, RN Outcome: Adequate for Discharge 05/17/2019 1346 by Jerene Dilling, RN Outcome: Adequate for Discharge Goal: Individualized Educational Video(s) 05/17/2019 1346 by Jerene Dilling, RN Outcome: Adequate for Discharge 05/17/2019 1346 by Jerene Dilling, RN Outcome: Adequate for Discharge   Problem: Activity: Goal: Capacity to carry out activities will improve 05/17/2019 1346 by Jerene Dilling, RN Outcome: Adequate for Discharge 05/17/2019 1346 by Jerene Dilling, RN Outcome: Adequate for Discharge   Problem: Cardiac: Goal: Ability to achieve and maintain adequate cardiopulmonary perfusion will improve 05/17/2019 1346 by Jerene Dilling, RN Outcome: Adequate for Discharge 05/17/2019 1346 by Jerene Dilling, RN Outcome: Adequate for Discharge

## 2019-05-17 NOTE — Discharge Summary (Signed)
Andrew Watts at [place] NAME: Andrew Watts    MR#:  [id number]  DATE OF BIRTH:  1934-08-18  DATE OF ADMISSION:  05/14/2019 ADMITTING PHYSICIAN: Andrew Mormon, MD  DATE OF DISCHARGE: 05/17/2019  2:06 PM  PRIMARY CARE PHYSICIAN: Andrew Passy, PA    ADMISSION DIAGNOSIS:  Shortness of breath [R06.02] Acute CHF (congestive heart failure) (H[facility]) [I50.9]  DISCHARGE DIAGNOSIS:  Active Problems:   Acute CHF (congestive heart failure) (H[facility])   Acute systolic CHF (congestive heart failure) (H[facility])   Atrial fibrillation with RVR ([facility])   Chronic bilateral pleural effusions   Chronic renal failure, stage 3b   Benign prostatic hyperplasia   Hyperlipidemia   SECONDARY DIAGNOSIS:   Past Medical History:  Diagnosis Date  . Chronic kidney disease    told 7-8 yrs ago. kidney function only 35%  . Diabetes mellitus without complication ([place])   . Hypercholesteremia   . Myocardial infarction ([place]) 1986   x2  . Wears dentures    full upper and lower    HOSPITAL COURSE:   1.  Acute systolic congestive heart failure.  The patient was on Lasix 40 mg IV twice daily during the hospital course.  I held Lasix this morning since his creatinine went up just a little bit and his lungs sounded much better and he was lying flat.  Patient on metoprolol.  Unable to give ACE inhibitor at this time secondary to relative hypotension.  Unable to give spironolactone secondary to worsening creatinine. 2.  Atrial fibrillation with rapid ventricular response.  New atrial fibrillation likely chronic.  I had to increase his metoprolol to 100 mg twice a day.  Since his creatinine worsened I got rid of the digoxin and started amiodarone 200 mg twice a day.  We will taper her amiodarone to once a day dosing after 7 days.  Heart rate resting in the 90s and with ambulation around 110.  Patient felt well enough to go home.  Follow-up quickly as outpatient.  Hopefully can go down on  metoprolol dose as outpatient.  Eliquis low-dose for anticoagulation prescribed.  Benefits and risks explained to patient and family of bleeding. 3.  Chronic kidney disease stage IIIb.  Held Lasix today.  Can restart oral Lasix tomorrow. 4.  Chronic bilateral pleural effusions.  Thoracentesis on the right side removed 700 mL.  Since the patient was breathing better I decided to hold off on a thoracentesis on the left side. 5.  BPH on Terazosin and finasteride 6.  Type 2 diabetes mellitus with hyperlipidemia unspecified on simvastatin.  Glipizide discontinued and Tradjenta prescribed  DISCHARGE CONDITIONS:   Satisfactory  CONSULTS OBTAINED:  Treatment Team:  Andrew Spray, MD  DRUG ALLERGIES:  No Known Allergies  DISCHARGE MEDICATIONS:   Allergies as of 05/17/2019   No Known Allergies     Medication List    STOP taking these medications   amLODipine 10 MG tablet Commonly known as: NORVASC   aspirin 81 MG tablet   glipiZIDE 5 MG tablet Commonly known as: GLUCOTROL   hydrochlorothiazide 25 MG tablet Commonly known as: HYDRODIURIL   lisinopril 10 MG tablet Commonly known as: ZESTRIL     TAKE these medications   amiodarone 200 MG tablet Commonly known as: PACERONE One tab twice a day for 7 days then one tab po daily afterwards   apixaban 2.5 MG Tabs tablet Commonly known as: ELIQUIS Take 1 tablet (2.5 mg total) by mouth 2 (two) times  daily.   feeding supplement (NEPRO CARB STEADY) Liqd Take 237 mLs by mouth 2 (two) times daily between meals.   finasteride 5 MG tablet Commonly known as: PROSCAR Take 5 mg by mouth daily.   furosemide 20 MG tablet Commonly known as: Lasix Take 2 tablets (40 mg total) by mouth daily.   linagliptin 5 MG Tabs tablet Commonly known as: TRADJENTA Take 1 tablet (5 mg total) by mouth daily.   metoprolol tartrate 100 MG tablet Commonly known as: LOPRESSOR Take 1 tablet (100 mg total) by mouth 2 (two) times daily. What changed:    medication strength  how much to take   simvastatin 20 MG tablet Commonly known as: ZOCOR Take 20 mg by mouth daily.   terazosin 2 MG capsule Commonly known as: HYTRIN Take 2 mg by mouth daily.        DISCHARGE INSTRUCTIONS:   Follow-up PMD 5 days Follow-up Dr. Lady Gary cardiology 1 week  If you experience worsening of your admission symptoms, develop shortness of breath, life threatening emergency, suicidal or homicidal thoughts you must seek medical attention immediately by calling 911 or calling your MD immediately  if symptoms less severe.  You Must read complete instructions/literature along with all the possible adverse reactions/side effects for all the Medicines you take and that have been prescribed to you. Take any new Medicines after you have completely understood and accept all the possible adverse reactions/side effects.   Please note  You were cared for by a hospitalist during your hospital stay. If you have any questions about your discharge medications or the care you received while you were in the hospital after you are discharged, you can call the unit and asked to speak with the hospitalist on call if the hospitalist that took care of you is not available. Once you are discharged, your primary care physician will handle any further medical issues. Please note that NO REFILLS for any discharge medications will be authorized once you are discharged, as it is imperative that you return to your primary care physician (or establish a relationship with a primary care physician if you do not have one) for your aftercare needs so that they can reassess your need for medications and monitor your lab values.    Today   CHIEF COMPLAINT:  No chief complaint on file.  HISTORY OF PRESENT ILLNESS:  Andrew Watts  is a 85 y.o. male came in with shortness of breath   VITAL SIGNS:  Blood pressure 99/64, pulse (!) 104, temperature (!) 97.5 F (36.4 C), temperature source  Oral, resp. rate 18, height 5\' 11"  (1.803 m), weight 79.5 kg, SpO2 95 %.   PHYSICAL EXAMINATION:  GENERAL:  85 y.o.-year-old patient lying in the bed with no acute distress.  EYES: Pupils equal, round, reactive to light and accommodation. No scleral icterus. Extraocular muscles intact.  HEENT: Head atraumatic, normocephalic. Oropharynx and nasopharynx clear.  NECK:  Supple, no jugular venous distention. No thyroid enlargement, no tenderness.  LUNGS: Decreased breath sounds bilateral bases, no wheezing, rales,rhonchi or crepitation. No use of accessory muscles of respiration.  CARDIOVASCULAR: S1, S2 irregularly irregular. No murmurs, rubs, or gallops.  ABDOMEN: Soft, non-tender, non-distended. Bowel sounds present. No organomegaly or mass.  EXTREMITIES: Trace pedal edema, no cyanosis, or clubbing.  NEUROLOGIC: Cranial nerves II through XII are intact. Muscle strength 5/5 in all extremities. Sensation intact. Gait not checked.  PSYCHIATRIC: The patient is alert and oriented x 3.  SKIN: No obvious rash, lesion, or ulcer.  DATA REVIEW:   CBC Recent Labs  Lab 05/15/19 0447  WBC 8.0  HGB 11.0*  HCT 33.3*  PLT 127*    Chemistries  Recent Labs  Lab 05/14/19 1421 05/14/19 2146 05/15/19 0447 05/17/19 0522  NA 136  --    < > 137  K 4.2  --    < > 3.7  CL 107  --    < > 101  CO2 20*  --    < > 25  GLUCOSE 288*  --    < > 207*  BUN 48*  --    < > 47*  CREATININE 1.78*  --    < > 1.95*  CALCIUM 9.2  --    < > 9.2  MG  --  1.9  --   --   AST 13*  --   --   --   ALT 16  --   --   --   ALKPHOS 69  --   --   --   BILITOT 0.8  --   --   --    < > = values in this interval not displayed.    Microbiology Results  Results for orders placed or performed during the hospital encounter of 05/14/19  SARS CORONAVIRUS 2 (TAT 6-24 HRS) Nasopharyngeal Nasopharyngeal Swab     Status: None   Collection Time: 05/14/19  7:24 PM   Specimen: Nasopharyngeal Swab  Result Value Ref Range Status    SARS Coronavirus 2 NEGATIVE NEGATIVE Final    Comment: (NOTE) SARS-CoV-2 target nucleic acids are NOT DETECTED. The SARS-CoV-2 RNA is generally detectable in upper and lower respiratory specimens during the acute phase of infection. Negative results do not preclude SARS-CoV-2 infection, do not rule out co-infections with other pathogens, and should not be used as the sole basis for treatment or other patient management decisions. Negative results must be combined with clinical observations, patient history, and epidemiological information. The expected result is Negative. Fact Sheet for Patients: HairSlick.no Fact Sheet for Healthcare Providers: quierodirigir.com This test is not yet approved or cleared by the [place] FDA and  has been authorized for detection and/or diagnosis of SARS-CoV-2 by FDA under an Emergency Use Authorization (EUA). This EUA will remain  in effect (meaning this test can be used) for the duration of the COVID-19 declaration under Section 56 4(b)(1) of the Act, 21 U.S.C. section 360bbb-3(b)(1), unless the authorization is terminated or revoked sooner. Performed at [facility], [address], [place], [place] [address]   Body fluid culture     Status: None (Preliminary result)   Collection Time: 05/15/19 12:23 PM   Specimen: PATH Cytology Pleural fluid  Result Value Ref Range Status   Specimen Description   Final    PLEURAL Performed at [facility], [address] [place], [place] [address]    Special Requests   Final    PLEURAL Performed at [facility], [address] [place], [place] [address]    Gram Stain   Final    FEW WBC PRESENT, PREDOMINANTLY PMN NO ORGANISMS SEEN    Culture   Final    NO GROWTH 2 DAYS Performed at [facility], [address], [place], [place] [address]    Report Status PENDING  Incomplete     Management plans discussed with  the patient, family and they are in agreement.  CODE STATUS:     Code Status Orders  (From admission, onward)  Start     Ordered   05/14/19 2006  Full code  Continuous     05/14/19 2008        Code Status History    This patient has a current code status but no historical code status.   Advance Care Planning Activity    Advance Directive Documentation     Most Recent Value  Type of Advance Directive  Healthcare Power of Attorney  Pre-existing out of facility DNR order (yellow form or pink MOST form)  --  "MOST" Form in Place?  --      TOTAL TIME TAKING CARE OF THIS PATIENT: 35 minutes.    Alford Highland M.D on 05/17/2019 at 3:10 PM  Between 7am to 6pm - Pager - [phone]  After 6pm go to [email or web address] - password [email or web address]  [facility]  CC: Primary care physician; Noel Gerold, PA

## 2019-05-17 NOTE — Progress Notes (Signed)
PT Cancellation Note  Patient Details Name: Andrew Watts MRN: [id number] DOB: 08-24-1934   Cancelled Treatment:    Reason Eval/Treat Not Completed: Medical issues which prohibited therapy.  Chart reviewed.  Pt's HR noted to be fluctuating between 113-154 bpm on telemetry screen (pt noted to be sitting on edge of bed).  D/t elevated HR, will hold PT at this time and re-attempt PT evaluation this afternoon as medically appropriate (discussed with MD [place]).  Hendricks Limes, PT 05/17/19, 9:47 AM

## 2019-05-17 NOTE — Progress Notes (Signed)
PT Cancellation Note  Patient Details Name: Andrew Watts MRN: [id number] DOB: 07-Aug-1934   Cancelled Treatment:    Reason Eval/Treat Not Completed: Other (comment).  Pt sitting in chair, dressed in his own clothes, and reporting being ready for discharge upon PT arrival (nurse reports plan for discharge today).  Pt reports recently walking in hallway and HR stayed at "92".  Pt reports no issues with walking and declined physical therapy session (pt reporting no PT needs).  D/t pt declining physical therapy and plan for discharge today, will sign off.  Please re-consult PT if pt's status changes and acute PT needs are identified.  Hendricks Limes, PT 05/17/19, 1:26 PM

## 2019-05-17 NOTE — Progress Notes (Signed)
Inpatient Diabetes Program Recommendations  AACE/ADA: New Consensus Statement on Inpatient Glycemic Control (2015)  Target Ranges:  Prepandial:   less than 140 mg/dL      Peak postprandial:   less than 180 mg/dL (1-2 hours)      Critically ill patients:  140 - 180 mg/dL   Lab Results  Component Value Date   GLUCAP 211 (H) 05/17/2019   HGBA1C 11.3 (H) 05/14/2019    Review of Glycemic Control Results for ADAIN, GEURIN (MRN [id number]) as of 05/17/2019 10:20  Ref. Range 05/16/2019 16:21 05/16/2019 20:04 05/17/2019 08:04  Glucose-Capillary Latest Ref Range: 70 - 99 mg/dL 721 (H) 828 (H) 833 (H)  Diabetes history: DM2 Outpatient Diabetes medications: Glucotrol 5 mg BID (not taken in a while since taken off it due to kidney function) Current orders for Inpatient glycemic control: Novolog 0-9 units AC&HS  Inpatient Diabetes Program Recommendations:    Oral DM medication: May want to consider ordering Tradjenta 5 mg daily as an inpatient (low risk of hypoglycemia and eliminated via feces) and at discharge.   Thanks,  Beryl Meager, RN, BC-ADM Inpatient Diabetes Coordinator Pager [phone] (8a-5p)

## 2019-05-17 NOTE — Discharge Instructions (Signed)
Pleural Effusion Pleural effusion is an abnormal buildup of fluid in the layers of tissue between the lungs and the inside of the chest (pleural space) The two layers of tissue that line the lungs and the inside of the chest are called pleura. Usually, there is no air in the space between the pleura, only a thin layer of fluid. Some conditions can cause a large amount of fluid to build up, which can cause the lung to collapse if untreated. A pleural effusion is usually caused by another disease that requires treatment. What are the causes? Pleural effusion can be caused by:  Heart failure.  Certain infections, such as pneumonia or tuberculosis.  Cancer.  A blood clot in the lung (pulmonary embolism).  Complications from surgery, such as from open heart surgery.  Liver disease (cirrhosis).  Kidney disease. What are the signs or symptoms? In some cases, pleural effusion may cause no symptoms. If symptoms are present, they may include:  Shortness of breath, especially when lying down.  Chest pain. This may get worse when taking a deep breath.  Fever.  Dry, long-lasting (chronic) cough.  Hiccups.  Rapid breathing. An underlying condition that is causing the pleural effusion (such as heart failure, pneumonia, blood clots, tuberculosis, or cancer) may also cause other symptoms. How is this diagnosed? This condition may be diagnosed based on:  Your symptoms and medical history.  A physical exam.  A chest X-ray.  A procedure to use a needle to remove fluid from the pleural space (thoracentesis). This fluid is tested.  Other imaging studies of the chest, such as ultrasound or CT scan. How is this treated? Depending on the cause of your condition, treatment may include:  Treating the underlying condition that is causing the effusion. When that condition improves, the effusion will also improve. Examples of treatment for underlying conditions include: ? Antibiotic medicines to  treat an infection. ? Diuretics or other heart medicines to treat heart failure.  Thoracentesis.  Placing a thin flexible tube under your skin and into your chest to continuously drain the effusion (indwelling pleural catheter).  Surgery to remove the outer layer of tissue from the pleural space (decortication).  A procedure to put medicine into the chest cavity to seal the pleural space and prevent fluid buildup (pleurodesis).  Chemotherapy and radiation therapy, if you have cancerous (malignant) pleural effusion. These treatments are typically used to treat cancer. They kill certain cells in the body. Follow these instructions at home:  Take over-the-counter and prescription medicines only as told by your health care provider.  Ask your health care provider what activities are safe for you.  Keep track of how long you are able to do mild exercise (such as walking) before you get short of breath. Write down this information to share with your health care provider. Your ability to exercise should improve over time.  Do not use any products that contain nicotine or tobacco, such as cigarettes and e-cigarettes. If you need help quitting, ask your health care provider.  Keep all follow-up visits as told by your health care provider. This is important. Contact a health care provider if:  The amount of time that you are able to do mild exercise: ? Decreases. ? Does not improve with time.  You have a fever. Get help right away if:  You are short of breath.  You develop chest pain.  You develop a new cough. Summary  Pleural effusion is an abnormal buildup of fluid in the layers   of tissue between the lungs and the inside of the chest.  Pleural effusion can have many causes, including heart failure, pulmonary embolism, infections, or cancer.  Symptoms of pleural effusion can include shortness of breath, chest pain, fever, long-lasting (chronic) cough, hiccups, or rapid  breathing.  Diagnosis often involves making images of the chest (such as with ultrasound or X-ray) and removing fluid (thoracentesis) to send for testing.  Treatment for pleural effusion depends on what underlying condition is causing it. This information is not intended to replace advice given to you by your health care provider. Make sure you discuss any questions you have with your health care provider. Document Revised: 02/24/2017 Document Reviewed: 11/17/2016 Elsevier Patient Education  2020 [employer]  

## 2019-05-18 LAB — BODY FLUID CULTURE: Culture: NO GROWTH

## 2019-05-20 ENCOUNTER — Telehealth: Payer: Self-pay | Admitting: Family

## 2019-05-20 ENCOUNTER — Encounter: Payer: Self-pay | Admitting: Family

## 2019-05-20 NOTE — Telephone Encounter (Signed)
Spoke with Son regarding patients New appointment with the [facility] on 2/25 and how patient is doing. He stated hes doing well, checking daily weight but informed them to make sure they call us if weight gain happens. Hes following a low sodium diet and taking meds as persribed. He is still weak since d/c but otherwise no real symptoms or complaints.   Deetta Perla, [place]

## 2019-05-23 ENCOUNTER — Ambulatory Visit: Payer: Medicare Other | Attending: Family | Admitting: Family

## 2019-05-23 ENCOUNTER — Other Ambulatory Visit: Payer: Self-pay

## 2019-05-23 ENCOUNTER — Encounter: Payer: Self-pay | Admitting: Family

## 2019-05-23 VITALS — BP 124/94 | HR 89 | Resp 16 | Ht 71.0 in | Wt 159.1 lb

## 2019-05-23 DIAGNOSIS — Z87891 Personal history of nicotine dependence: Secondary | ICD-10-CM | POA: Diagnosis not present

## 2019-05-23 DIAGNOSIS — N189 Chronic kidney disease, unspecified: Secondary | ICD-10-CM | POA: Diagnosis not present

## 2019-05-23 DIAGNOSIS — I252 Old myocardial infarction: Secondary | ICD-10-CM | POA: Insufficient documentation

## 2019-05-23 DIAGNOSIS — I480 Paroxysmal atrial fibrillation: Secondary | ICD-10-CM

## 2019-05-23 DIAGNOSIS — Z79899 Other long term (current) drug therapy: Secondary | ICD-10-CM | POA: Insufficient documentation

## 2019-05-23 DIAGNOSIS — Z7901 Long term (current) use of anticoagulants: Secondary | ICD-10-CM | POA: Insufficient documentation

## 2019-05-23 DIAGNOSIS — E785 Hyperlipidemia, unspecified: Secondary | ICD-10-CM | POA: Diagnosis not present

## 2019-05-23 DIAGNOSIS — I130 Hypertensive heart and chronic kidney disease with heart failure and stage 1 through stage 4 chronic kidney disease, or unspecified chronic kidney disease: Secondary | ICD-10-CM | POA: Diagnosis not present

## 2019-05-23 DIAGNOSIS — I4891 Unspecified atrial fibrillation: Secondary | ICD-10-CM | POA: Insufficient documentation

## 2019-05-23 DIAGNOSIS — E1122 Type 2 diabetes mellitus with diabetic chronic kidney disease: Secondary | ICD-10-CM | POA: Diagnosis not present

## 2019-05-23 DIAGNOSIS — E118 Type 2 diabetes mellitus with unspecified complications: Secondary | ICD-10-CM

## 2019-05-23 DIAGNOSIS — I5022 Chronic systolic (congestive) heart failure: Secondary | ICD-10-CM | POA: Diagnosis present

## 2019-05-23 DIAGNOSIS — I10 Essential (primary) hypertension: Secondary | ICD-10-CM

## 2019-05-23 DIAGNOSIS — E7800 Pure hypercholesterolemia, unspecified: Secondary | ICD-10-CM | POA: Insufficient documentation

## 2019-05-23 MED ORDER — FUROSEMIDE 40 MG PO TABS: 40.0000 mg | ORAL_TABLET | Freq: Every day | ORAL | 5 refills | Status: AC

## 2019-05-23 NOTE — Patient Instructions (Signed)
Continue weighing daily and call for an overnight weight gain of > 2 pounds or a weekly weight gain of >5 pounds. 

## 2019-05-23 NOTE — Progress Notes (Signed)
Patient ID: Andrew Watts, male    DOB: 1934/10/12, 85 y.o.   MRN: [id number]  HPI  Andrew Watts is a 85 y/o male with a history of HTN, DM, CKD, DM, hyperlipidemia, MI, previous tobacco use and chronic heart failure.   Echo report from 05/15/19 reviewed and showed an EF of 35-40% along with trivial Andrew and mildly enlarged right ventricle and mild biatrial enlargement.   Admitted 05/14/19 due to acute on chronic HF. Cardiology consult obtained. Initially given IV lasix and then transitioned to oral diuretics. Thoracentesis on the right side removed 700 mL. Was unable to use ACEi due to hypotension. Discharged after 3 days.   He presents today for his initial visit with a chief complaint of minimal shortness of breath upon moderate exertion. He describes this as chronic in nature having been present for several months. He has associated cough, fatigue and light-headedness along with this. He denies any difficulty sleeping, abdominal distention, palpitations, pedal edema, chest pain or weight gain.   He is currently not taking any medications for his diabetes as he says that his glucophage was stopped due to renal issues but he can't afford the tradjenta. He says that he's been getting all his care at the [place] but doesn't feel like he's getting good care. He is looking into whether he can financially afford to move all his care locally and not have to keep going back to the [place].   Past Medical History:  Diagnosis Date  . CHF (congestive heart failure) ([place])   . Chronic kidney disease    told 7-8 yrs ago. kidney function only 35%  . Diabetes mellitus without complication ([place])   . Hypercholesteremia   . Hypertension   . Myocardial infarction ([place]) 1986   x2  . Wears dentures    full upper and lower   Past Surgical History:  Procedure Laterality Date  . CARDIAC CATHETERIZATION  1986   stent placed  . CATARACT EXTRACTION W/PHACO Right 02/23/2016   Procedure: CATARACT EXTRACTION PHACO AND  INTRAOCULAR LENS PLACEMENT (IOC);  Surgeon: Eulogio Bear, MD;  Location: [facility];  Service: Ophthalmology;  Laterality: Right;  RIGHT DIABETIC - oral meds  . CATARACT EXTRACTION W/PHACO Left 04/05/2016   Procedure: CATARACT EXTRACTION PHACO AND INTRAOCULAR LENS PLACEMENT (IOC);  Surgeon: Eulogio Bear, MD;  Location: [facility];  Service: Ophthalmology;  Laterality: Left;  LEFT DIABETES - oral meds IVA TOPICAL  . [facility]   History reviewed. No pertinent family history. Social History   Tobacco Use  . Smoking status: Former Smoker    Quit date: 03/28/1985    Years since quitting: 34.1  . Smokeless tobacco: Never Used  Substance Use Topics  . Alcohol use: No   No Known Allergies Prior to Admission medications   Medication Sig Start Date End Date Taking? Authorizing Provider  amiodarone (PACERONE) 200 MG tablet One tab twice a day for 7 days then one tab po daily afterwards 05/17/19  Yes Wieting, Richard, MD  apixaban (ELIQUIS) 2.5 MG TABS tablet Take 1 tablet (2.5 mg total) by mouth 2 (two) times daily. 05/17/19  Yes Wieting, Richard, MD  finasteride (PROSCAR) 5 MG tablet Take 5 mg by mouth daily.   Yes [provider]  furosemide (LASIX) 40 MG tablet Take 1 tablet (40 mg total) by mouth daily. 05/23/19  Yes Darylene Price A, FNP  metoprolol tartrate (LOPRESSOR) 100 MG tablet Take 1 tablet (100 mg total)  by mouth 2 (two) times daily. 05/17/19  Yes Wieting, Richard, MD  Nutritional Supplements (FEEDING SUPPLEMENT, NEPRO CARB STEADY,) LIQD Take 237 mLs by mouth 2 (two) times daily between meals. 05/17/19  Yes Wieting, Richard, MD  simvastatin (ZOCOR) 20 MG tablet Take 20 mg by mouth daily.   Yes [provider]  terazosin (HYTRIN) 2 MG capsule Take 2 mg by mouth daily.   Yes [provider]  linagliptin (TRADJENTA) 5 MG TABS tablet Take 1 tablet (5 mg total) by mouth daily. Patient not taking: Reported on 05/23/2019 05/17/19    Alford Highland, MD     Review of Systems  Constitutional: Positive for fatigue. Negative for appetite change.  HENT: Negative for congestion, postnasal drip and sore throat.   Eyes: Negative.   Respiratory: Positive for cough (dry hacky) and shortness of breath.   Cardiovascular: Negative for chest pain, palpitations and leg swelling.  Gastrointestinal: Negative for abdominal distention and abdominal pain.  Endocrine: Negative.   Genitourinary: Negative.   Musculoskeletal: Negative for back pain and neck pain.  Skin: Negative.   Allergic/Immunologic: Negative.   Neurological: Positive for light-headedness (with sudden position changes). Negative for dizziness.  Hematological: Negative for adenopathy. Does not bruise/bleed easily.  Psychiatric/Behavioral: Negative for dysphoric mood and sleep disturbance (sleeping on 1 pillow). The patient is not nervous/anxious.     Vitals:   05/23/19 1337  BP: (!) 124/94  Pulse: 89  Resp: 16  SpO2: 99%  Weight: 159 lb 2 oz (72.2 kg)  Height: 5\' 11"  (1.803 m)   Wt Readings from Last 3 Encounters:  05/23/19 159 lb 2 oz (72.2 kg)  05/17/19 175 lb 4.8 oz (79.5 kg)  04/05/16 169 lb 4.8 oz (76.8 kg)   Lab Results  Component Value Date   CREATININE 1.95 (H) 05/17/2019   CREATININE 1.61 (H) 05/16/2019   CREATININE 1.68 (H) 05/15/2019     Physical Exam Vitals and nursing note reviewed.  Constitutional:      Appearance: He is well-developed.  HENT:     Head: Normocephalic and atraumatic.  Neck:     Vascular: No JVD.  Cardiovascular:     Rate and Rhythm: Normal rate. Rhythm irregular.  Pulmonary:     Effort: Pulmonary effort is normal. No respiratory distress.     Breath sounds: No wheezing or rales.  Abdominal:     Palpations: Abdomen is soft.     Tenderness: There is no abdominal tenderness.  Musculoskeletal:     Cervical back: Normal range of motion and neck supple.     Right lower leg: No tenderness. No edema.     Left lower  leg: No tenderness. No edema.  Skin:    General: Skin is warm and dry.  Neurological:     General: No focal deficit present.     Mental Status: He is alert and oriented to person, place, and time.  Psychiatric:        Mood and Affect: Mood normal.        Behavior: Behavior normal.     Assessment & Plan:  1: Chronic heart failure with reduced ejection fraction- - NYHA class II - euvolemic today - weighing daily and he knows to call for an overnight weight gain of >2 pounds or a weekly weight gain of >5 pounds - not adding salt to his food and is closely monitoring his sodium intake; low sodium cookbook given to him - renal function may not be able to tolerate entresto or farxiga -  sees cardiology ([facility]) later today - BNP 05/14/19 was 1017.0  2: HTN- - BP ok today - currently seeing PCP at the [facility] - BMP from 05/17/19 reviewed and showed sodium 137, potassium 3.7, creatinine 1.95 and GFR 31  3: Atrial fibrillation- - seeing cardiology later today - on metoprolol , amiodarone and apixaban  4: DM- - A1c on 05/14/19 was 11.3% - glucose at home today was 205 - currently not taking any therapy as his glucophage was stopped and he can't afford his tradjenta - emphasized that he needed to either return to his PCP or get established with endocrinology here locally  Patient did not bring his medications nor a list. Each medication was verbally reviewed with the patient and he was encouraged to bring the bottles to every visit to confirm accuracy of list.  Return in 2 months or sooner for any questions/problems before then.

## 2019-05-25 ENCOUNTER — Encounter: Payer: Self-pay | Admitting: Family

## 2019-07-22 ENCOUNTER — Ambulatory Visit: Payer: PRIVATE HEALTH INSURANCE | Admitting: Family

## 2019-11-04 ENCOUNTER — Other Ambulatory Visit: Payer: Self-pay

## 2019-11-04 ENCOUNTER — Ambulatory Visit (INDEPENDENT_AMBULATORY_CARE_PROVIDER_SITE_OTHER)
Admission: EM | Admit: 2019-11-04 | Discharge: 2019-11-04 | Disposition: A | Discharge status: Other Acute Inpatient Hospital | Payer: Medicare Other | Source: Home / Self Care | Attending: Family Medicine | Admitting: Family Medicine

## 2019-11-04 ENCOUNTER — Emergency Department: Payer: Medicare Other

## 2019-11-04 DIAGNOSIS — E785 Hyperlipidemia, unspecified: Secondary | ICD-10-CM | POA: Diagnosis present

## 2019-11-04 DIAGNOSIS — Z7901 Long term (current) use of anticoagulants: Secondary | ICD-10-CM

## 2019-11-04 DIAGNOSIS — Z888 Allergy status to other drugs, medicaments and biological substances status: Secondary | ICD-10-CM

## 2019-11-04 DIAGNOSIS — I130 Hypertensive heart and chronic kidney disease with heart failure and stage 1 through stage 4 chronic kidney disease, or unspecified chronic kidney disease: Secondary | ICD-10-CM | POA: Diagnosis present

## 2019-11-04 DIAGNOSIS — I252 Old myocardial infarction: Secondary | ICD-10-CM

## 2019-11-04 DIAGNOSIS — N179 Acute kidney failure, unspecified: Secondary | ICD-10-CM

## 2019-11-04 DIAGNOSIS — N400 Enlarged prostate without lower urinary tract symptoms: Secondary | ICD-10-CM | POA: Diagnosis present

## 2019-11-04 DIAGNOSIS — K8000 Calculus of gallbladder with acute cholecystitis without obstruction: Secondary | ICD-10-CM | POA: Diagnosis not present

## 2019-11-04 DIAGNOSIS — I480 Paroxysmal atrial fibrillation: Secondary | ICD-10-CM | POA: Diagnosis present

## 2019-11-04 DIAGNOSIS — E860 Dehydration: Secondary | ICD-10-CM | POA: Diagnosis present

## 2019-11-04 DIAGNOSIS — I2510 Atherosclerotic heart disease of native coronary artery without angina pectoris: Secondary | ICD-10-CM | POA: Diagnosis present

## 2019-11-04 DIAGNOSIS — Z20822 Contact with and (suspected) exposure to covid-19: Secondary | ICD-10-CM | POA: Diagnosis present

## 2019-11-04 DIAGNOSIS — D696 Thrombocytopenia, unspecified: Secondary | ICD-10-CM | POA: Diagnosis present

## 2019-11-04 DIAGNOSIS — Z87891 Personal history of nicotine dependence: Secondary | ICD-10-CM

## 2019-11-04 DIAGNOSIS — R17 Unspecified jaundice: Secondary | ICD-10-CM

## 2019-11-04 DIAGNOSIS — R7401 Elevation of levels of liver transaminase levels: Secondary | ICD-10-CM | POA: Diagnosis present

## 2019-11-04 DIAGNOSIS — R7989 Other specified abnormal findings of blood chemistry: Secondary | ICD-10-CM | POA: Diagnosis not present

## 2019-11-04 DIAGNOSIS — R112 Nausea with vomiting, unspecified: Secondary | ICD-10-CM

## 2019-11-04 DIAGNOSIS — Z79899 Other long term (current) drug therapy: Secondary | ICD-10-CM

## 2019-11-04 DIAGNOSIS — E1122 Type 2 diabetes mellitus with diabetic chronic kidney disease: Secondary | ICD-10-CM | POA: Diagnosis present

## 2019-11-04 DIAGNOSIS — N1832 Chronic kidney disease, stage 3b: Secondary | ICD-10-CM | POA: Diagnosis present

## 2019-11-04 DIAGNOSIS — I5023 Acute on chronic systolic (congestive) heart failure: Secondary | ICD-10-CM | POA: Diagnosis present

## 2019-11-04 DIAGNOSIS — K8010 Calculus of gallbladder with chronic cholecystitis without obstruction: Secondary | ICD-10-CM | POA: Diagnosis present

## 2019-11-04 DIAGNOSIS — J90 Pleural effusion, not elsewhere classified: Secondary | ICD-10-CM | POA: Diagnosis present

## 2019-11-04 DIAGNOSIS — E7800 Pure hypercholesterolemia, unspecified: Secondary | ICD-10-CM | POA: Diagnosis present

## 2019-11-04 LAB — COMPREHENSIVE METABOLIC PANEL
ALT: 657 U/L — ABNORMAL HIGH (ref 0–44)
AST: 1392 U/L — ABNORMAL HIGH (ref 15–41)
Albumin: 3.9 g/dL (ref 3.5–5.0)
Alkaline Phosphatase: 197 U/L — ABNORMAL HIGH (ref 38–126)
Anion gap: 17 — ABNORMAL HIGH (ref 5–15)
BUN: 59 mg/dL — ABNORMAL HIGH (ref 8–23)
CO2: 22 mmol/L (ref 22–32)
Calcium: 8.9 mg/dL (ref 8.9–10.3)
Chloride: 92 mmol/L — ABNORMAL LOW (ref 98–111)
Creatinine, Ser: 2.57 mg/dL — ABNORMAL HIGH (ref 0.61–1.24)
GFR calc Af Amer: 26 mL/min — ABNORMAL LOW (ref >60)
GFR calc non Af Amer: 22 mL/min — ABNORMAL LOW (ref >60)
Glucose, Bld: 246 mg/dL — ABNORMAL HIGH (ref 70–99)
Potassium: 3.7 mmol/L (ref 3.5–5.1)
Sodium: 131 mmol/L — ABNORMAL LOW (ref 135–145)
Total Bilirubin: 4.4 mg/dL — ABNORMAL HIGH (ref 0.3–1.2)
Total Protein: 8 g/dL (ref 6.5–8.1)

## 2019-11-04 LAB — CBC WITH DIFFERENTIAL/PLATELET
Abs Immature Granulocytes: 0.04 10*3/uL (ref 0.00–0.07)
Basophils Absolute: 0 10*3/uL (ref 0.0–0.1)
Basophils Relative: 0 %
Eosinophils Absolute: 0 10*3/uL (ref 0.0–0.5)
Eosinophils Relative: 0 %
HCT: 43.1 % (ref 39.0–52.0)
Hemoglobin: 14.4 g/dL (ref 13.0–17.0)
Immature Granulocytes: 0 %
Lymphocytes Relative: 5 %
Lymphs Abs: 0.5 10*3/uL — ABNORMAL LOW (ref 0.7–4.0)
MCH: 31.2 pg (ref 26.0–34.0)
MCHC: 33.4 g/dL (ref 30.0–36.0)
MCV: 93.3 fL (ref 80.0–100.0)
Monocytes Absolute: 0.4 10*3/uL (ref 0.1–1.0)
Monocytes Relative: 4 %
Neutro Abs: 9 10*3/uL — ABNORMAL HIGH (ref 1.7–7.7)
Neutrophils Relative %: 91 %
Platelets: 119 10*3/uL — ABNORMAL LOW (ref 150–400)
RBC: 4.62 MIL/uL (ref 4.22–5.81)
RDW: 12.8 % (ref 11.5–15.5)
WBC: 9.9 10*3/uL (ref 4.0–10.5)
nRBC: 0 % (ref 0.0–0.2)

## 2019-11-04 LAB — PROTIME-INR
INR: 1.7 — ABNORMAL HIGH (ref 0.8–1.2)
Prothrombin Time: 19.1 seconds — ABNORMAL HIGH (ref 11.4–15.2)

## 2019-11-04 LAB — LIPASE, BLOOD: Lipase: 21 U/L (ref 11–51)

## 2019-11-04 MED ORDER — SODIUM CHLORIDE 0.9 % IV BOLUS
1000.0000 mL | Freq: Once | INTRAVENOUS | Status: AC
  Administered 2019-11-04: 1000 mL via INTRAVENOUS

## 2019-11-04 NOTE — ED Triage Notes (Signed)
Pt to ED via EMS with reports of vomiting since Sunday. Pt has been unable to eat or drink and went to UC today who drew labs and told pt to go to ED for elevated liver enzymes, kidney enzymes and irregular electrolytes. Blood work is in [facility] at this time. Pt confirms pain to the right side of abd. No diarrhea and no changes in urine. No medications taken at home.

## 2019-11-04 NOTE — ED Provider Notes (Signed)
[facility]    CSN: [id number] Arrival date & time: 11/04/19  1949   History   Chief Complaint Chief Complaint  Patient presents with  . Emesis    HPI  85 year old male presents with intractable nausea and vomiting.  Patient presents to urgent care with his wife.  He has been having intractable nausea and vomiting for 2 days.  He reports that he now has nothing additional to throw up.  He reports upper abdominal pain which she attributes to retching and dry heaving.  Pain 6/10 in severity.  No relieving factors.  Patient afebrile currently but reports that he had a temperature of 100.1 earlier today.  Patient believes that this is secondary to something he ate.  No relieving factors.  Patient is currently experiencing chills.  No other associated symptoms.  No other complaints.  Past Medical History:  Diagnosis Date  . CHF (congestive heart failure) ([place])   . Chronic kidney disease    told 7-8 yrs ago. kidney function only 35%  . Diabetes mellitus without complication ([place])   . Hypercholesteremia   . Hypertension   . Myocardial infarction ([place]) 1986   x2  . Wears dentures    full upper and lower    Patient Active Problem List   Diagnosis Date Noted  . Type 2 diabetes mellitus with hyperlipidemia ([place])   . Acute systolic CHF (congestive heart failure) ([place])   . Atrial fibrillation with RVR ([place])   . Chronic bilateral pleural effusions   . Chronic renal failure, stage 3b   . Benign prostatic hyperplasia   . Hyperlipidemia   . Acute CHF (congestive heart failure) ([place]) 05/14/2019    Past Surgical History:  Procedure Laterality Date  . CARDIAC CATHETERIZATION  1986   stent placed  . CATARACT EXTRACTION W/PHACO Right 02/23/2016   Procedure: CATARACT EXTRACTION PHACO AND INTRAOCULAR LENS PLACEMENT (IOC);  Surgeon: Eulogio Bear, MD;  Location: [facility];  Service: Ophthalmology;  Laterality: Right;  RIGHT DIABETIC - oral meds  . CATARACT  EXTRACTION W/PHACO Left 04/05/2016   Procedure: CATARACT EXTRACTION PHACO AND INTRAOCULAR LENS PLACEMENT (IOC);  Surgeon: Eulogio Bear, MD;  Location: [facility];  Service: Ophthalmology;  Laterality: Left;  LEFT DIABETES - oral meds IVA TOPICAL  . [facility]       Home Medications    Prior to Admission medications   Medication Sig Start Date End Date Taking? Authorizing Provider  amiodarone (PACERONE) 200 MG tablet One tab twice a day for 7 days then one tab po daily afterwards 05/17/19  Yes Wieting, Richard, MD  apixaban (ELIQUIS) 2.5 MG TABS tablet Take 1 tablet (2.5 mg total) by mouth 2 (two) times daily. 05/17/19  Yes Wieting, Richard, MD  finasteride (PROSCAR) 5 MG tablet Take 5 mg by mouth daily.   Yes [provider]  furosemide (LASIX) 40 MG tablet Take 1 tablet (40 mg total) by mouth daily. 05/23/19  Yes Darylene Price A, FNP  linagliptin (TRADJENTA) 5 MG TABS tablet Take 1 tablet (5 mg total) by mouth daily. 05/17/19  Yes Wieting, Richard, MD  metoprolol tartrate (LOPRESSOR) 100 MG tablet Take 1 tablet (100 mg total) by mouth 2 (two) times daily. 05/17/19  Yes Wieting, Richard, MD  Nutritional Supplements (FEEDING SUPPLEMENT, NEPRO CARB STEADY,) LIQD Take 237 mLs by mouth 2 (two) times daily between meals. 05/17/19  Yes Wieting, Richard, MD  simvastatin (ZOCOR) 20 MG tablet Take 20 mg by  mouth daily.   Yes [provider]  terazosin (HYTRIN) 2 MG capsule Take 2 mg by mouth daily.   Yes [provider]    Family History History reviewed. No pertinent family history.  Social History Social History   Tobacco Use  . Smoking status: Former Smoker    Quit date: 03/28/1985    Years since quitting: 34.6  . Smokeless tobacco: Never Used  Vaping Use  . Vaping Use: Never used  Substance Use Topics  . Alcohol use: No  . Drug use: Not Currently     Allergies   Patient has no known allergies.   Review of Systems Review of Systems   Constitutional: Positive for appetite change and chills.  Gastrointestinal: Positive for abdominal pain, nausea and vomiting.   Physical Exam Triage Vital Signs ED Triage Vitals  Enc Vitals Group     BP 11/04/19 2032 110/71     Pulse Rate 11/04/19 2032 69     Resp 11/04/19 2032 18     Temp 11/04/19 2032 98.1 F (36.7 C)     Temp Source 11/04/19 2032 Oral     SpO2 11/04/19 2032 100 %     Weight 11/04/19 2030 155 lb (70.3 kg)     Height 11/04/19 2030 5' 11"  (1.803 m)     Head Circumference --      Peak Flow --      Pain Score 11/04/19 2029 6     Pain Loc --      Pain Edu? --      Excl. in [place]? --    Updated Vital Signs BP 110/71 (BP Location: Left Arm)   Pulse 69   Temp 98.1 F (36.7 C) (Oral)   Resp 18   Ht 5' 11"  (1.803 m)   Wt 70.3 kg   SpO2 100%   BMI 21.62 kg/m   Visual Acuity Right Eye Distance:   Left Eye Distance:   Bilateral Distance:    Right Eye Near:   Left Eye Near:    Bilateral Near:     Physical Exam Vitals and nursing note reviewed.  Constitutional:      Comments: Patient ill-appearing.  Shivering.  HENT:     Head: Normocephalic and atraumatic.  Eyes:     General:        Right eye: No discharge.        Left eye: No discharge.  Cardiovascular:     Rate and Rhythm: Normal rate and regular rhythm.  Pulmonary:     Effort: Pulmonary effort is normal. No respiratory distress.  Abdominal:     Palpations: Abdomen is soft.     Comments: Nondistended.  Patient endorsing tenderness in right upper quadrant upon palpation.  Neurological:     Mental Status: He is alert.  Psychiatric:        Mood and Affect: Mood normal.        Behavior: Behavior normal.    UC Treatments / Results  Labs (all labs ordered are listed, but only abnormal results are displayed) Labs Reviewed  CBC WITH DIFFERENTIAL/PLATELET - Abnormal; Notable for the following components:      Result Value   Platelets 119 (*)    Neutro Abs 9.0 (*)    Lymphs Abs 0.5 (*)    All  other components within normal limits  COMPREHENSIVE METABOLIC PANEL - Abnormal; Notable for the following components:   Sodium 131 (*)    Chloride 92 (*)    Glucose, Bld  246 (*)    BUN 59 (*)    Creatinine, Ser 2.57 (*)    AST 1,392 (*)    ALT 657 (*)    Alkaline Phosphatase 197 (*)    Total Bilirubin 4.4 (*)    GFR calc non Af Amer 22 (*)    GFR calc Af Amer 26 (*)    Anion gap 17 (*)    All other components within normal limits    EKG   Radiology No results found.  Procedures Procedures (including critical care time)  Medications Ordered in UC Medications  sodium chloride 0.9 % bolus 1,000 mL (1,000 mLs Intravenous New Bag/Given 11/04/19 2134)    Initial Impression / Assessment and Plan / UC Course  I have reviewed the triage vital signs and the nursing notes.  Pertinent labs & imaging results that were available during my care of the patient were reviewed by me and considered in my medical decision making (see chart for details).    85 year old male presents with nausea and vomiting.  No additional vomiting here.  Patient ill-appearing on exam.  Laboratory studies obtained and IV placed.  CBC with no leukocytosis.  Metabolic panel with multiple abnormalities.  Patient is clinically dehydrated with acute kidney injury, creatinine 2.57 with a BUN of 59.  Markedly elevated AST and ALT of 1392 and 657 respectively.  Bilirubin 4.4.  Alk phos markedly elevated at 197.  Patient is ill.  Receiving IV fluids currently.  Patient is being transported to the hospital via EMS.  Final Clinical Impressions(s) / UC Diagnoses   Final diagnoses:  AKI (acute kidney injury) ([place])  Elevated LFTs  Elevated bilirubin  Dehydration  Non-intractable vomiting with nausea, unspecified vomiting type   Discharge Instructions   None    ED Prescriptions    None     PDMP not reviewed this encounter.   Coral Spikes, [place] 11/04/19 2158

## 2019-11-04 NOTE — ED Triage Notes (Signed)
Patient presents to [facility] with wife. Patient wife states that he has been throwing up x 2 days. Patient states that he has been dry heaving and that has hurt the upper part of his abdomen.

## 2019-11-05 ENCOUNTER — Other Ambulatory Visit: Payer: Self-pay

## 2019-11-05 ENCOUNTER — Inpatient Hospital Stay
Admission: EM | Admit: 2019-11-05 | Discharge: 2019-11-10 | DRG: 444 | Disposition: A | Discharge status: Home / Self Care | Payer: Medicare Other | Attending: Internal Medicine | Admitting: Internal Medicine

## 2019-11-05 ENCOUNTER — Inpatient Hospital Stay: Payer: Medicare Other

## 2019-11-05 DIAGNOSIS — R17 Unspecified jaundice: Secondary | ICD-10-CM | POA: Diagnosis not present

## 2019-11-05 DIAGNOSIS — E7800 Pure hypercholesterolemia, unspecified: Secondary | ICD-10-CM | POA: Diagnosis present

## 2019-11-05 DIAGNOSIS — Z79899 Other long term (current) drug therapy: Secondary | ICD-10-CM | POA: Diagnosis not present

## 2019-11-05 DIAGNOSIS — K8000 Calculus of gallbladder with acute cholecystitis without obstruction: Secondary | ICD-10-CM | POA: Diagnosis present

## 2019-11-05 DIAGNOSIS — K819 Cholecystitis, unspecified: Secondary | ICD-10-CM

## 2019-11-05 DIAGNOSIS — Z888 Allergy status to other drugs, medicaments and biological substances status: Secondary | ICD-10-CM | POA: Diagnosis not present

## 2019-11-05 DIAGNOSIS — D696 Thrombocytopenia, unspecified: Secondary | ICD-10-CM | POA: Diagnosis present

## 2019-11-05 DIAGNOSIS — I5023 Acute on chronic systolic (congestive) heart failure: Secondary | ICD-10-CM | POA: Diagnosis present

## 2019-11-05 DIAGNOSIS — Z7901 Long term (current) use of anticoagulants: Secondary | ICD-10-CM | POA: Diagnosis not present

## 2019-11-05 DIAGNOSIS — K810 Acute cholecystitis: Secondary | ICD-10-CM | POA: Diagnosis not present

## 2019-11-05 DIAGNOSIS — Z87891 Personal history of nicotine dependence: Secondary | ICD-10-CM | POA: Diagnosis not present

## 2019-11-05 DIAGNOSIS — I480 Paroxysmal atrial fibrillation: Secondary | ICD-10-CM | POA: Diagnosis present

## 2019-11-05 DIAGNOSIS — E1122 Type 2 diabetes mellitus with diabetic chronic kidney disease: Secondary | ICD-10-CM | POA: Diagnosis present

## 2019-11-05 DIAGNOSIS — E860 Dehydration: Secondary | ICD-10-CM | POA: Diagnosis present

## 2019-11-05 DIAGNOSIS — J90 Pleural effusion, not elsewhere classified: Secondary | ICD-10-CM | POA: Diagnosis present

## 2019-11-05 DIAGNOSIS — I5022 Chronic systolic (congestive) heart failure: Secondary | ICD-10-CM

## 2019-11-05 DIAGNOSIS — R7401 Elevation of levels of liver transaminase levels: Secondary | ICD-10-CM

## 2019-11-05 DIAGNOSIS — K8010 Calculus of gallbladder with chronic cholecystitis without obstruction: Secondary | ICD-10-CM | POA: Diagnosis present

## 2019-11-05 DIAGNOSIS — I252 Old myocardial infarction: Secondary | ICD-10-CM | POA: Diagnosis not present

## 2019-11-05 DIAGNOSIS — R112 Nausea with vomiting, unspecified: Secondary | ICD-10-CM | POA: Diagnosis present

## 2019-11-05 DIAGNOSIS — N179 Acute kidney failure, unspecified: Secondary | ICD-10-CM | POA: Diagnosis present

## 2019-11-05 DIAGNOSIS — N400 Enlarged prostate without lower urinary tract symptoms: Secondary | ICD-10-CM | POA: Diagnosis present

## 2019-11-05 DIAGNOSIS — I2510 Atherosclerotic heart disease of native coronary artery without angina pectoris: Secondary | ICD-10-CM | POA: Diagnosis present

## 2019-11-05 DIAGNOSIS — E785 Hyperlipidemia, unspecified: Secondary | ICD-10-CM | POA: Diagnosis present

## 2019-11-05 DIAGNOSIS — N1832 Chronic kidney disease, stage 3b: Secondary | ICD-10-CM | POA: Diagnosis present

## 2019-11-05 DIAGNOSIS — Z20822 Contact with and (suspected) exposure to covid-19: Secondary | ICD-10-CM | POA: Diagnosis present

## 2019-11-05 DIAGNOSIS — I130 Hypertensive heart and chronic kidney disease with heart failure and stage 1 through stage 4 chronic kidney disease, or unspecified chronic kidney disease: Secondary | ICD-10-CM | POA: Diagnosis present

## 2019-11-05 DIAGNOSIS — R110 Nausea: Secondary | ICD-10-CM

## 2019-11-05 DIAGNOSIS — N183 Chronic kidney disease, stage 3 unspecified: Secondary | ICD-10-CM | POA: Diagnosis present

## 2019-11-05 LAB — GLUCOSE, CAPILLARY
Glucose-Capillary: 165 mg/dL — ABNORMAL HIGH (ref 70–99)
Glucose-Capillary: 127 mg/dL — ABNORMAL HIGH (ref 70–99)
Glucose-Capillary: 208 mg/dL — ABNORMAL HIGH (ref 70–99)
Glucose-Capillary: 172 mg/dL — ABNORMAL HIGH (ref 70–99)

## 2019-11-05 LAB — SARS CORONAVIRUS 2 BY RT PCR (HOSPITAL ORDER, PERFORMED IN ~~LOC~~ HOSPITAL LAB): SARS Coronavirus 2: NEGATIVE

## 2019-11-05 LAB — TROPONIN I (HIGH SENSITIVITY): Troponin I (High Sensitivity): 52 ng/L — ABNORMAL HIGH (ref <18)

## 2019-11-05 MED ORDER — ONDANSETRON HCL 4 MG/2ML IJ SOLN
4.0000 mg | Freq: Four times a day (QID) | INTRAMUSCULAR | Status: DC | PRN
  Administered 2019-11-07 (×2): 4 mg via INTRAVENOUS
  Filled 2019-11-05 (×2): qty 2

## 2019-11-05 MED ORDER — FINASTERIDE 5 MG PO TABS
5.0000 mg | ORAL_TABLET | Freq: Every day | ORAL | Status: DC
  Administered 2019-11-05 – 2019-11-10 (×6): 5 mg via ORAL
  Filled 2019-11-05 (×6): qty 1

## 2019-11-05 MED ORDER — AMIODARONE HCL 200 MG PO TABS
200.0000 mg | ORAL_TABLET | Freq: Every day | ORAL | Status: DC
  Administered 2019-11-05 – 2019-11-10 (×5): 200 mg via ORAL
  Filled 2019-11-05 (×6): qty 1

## 2019-11-05 MED ORDER — ONDANSETRON HCL 4 MG PO TABS: 4.0000 mg | ORAL_TABLET | Freq: Four times a day (QID) | ORAL | Status: DC | PRN

## 2019-11-05 MED ORDER — METOPROLOL TARTRATE 50 MG PO TABS
100.0000 mg | ORAL_TABLET | Freq: Two times a day (BID) | ORAL | Status: DC
  Filled 2019-11-05 (×3): qty 2

## 2019-11-05 MED ORDER — PIPERACILLIN-TAZOBACTAM 3.375 G IVPB
3.3750 g | Freq: Three times a day (TID) | INTRAVENOUS | Status: DC
  Administered 2019-11-05 – 2019-11-10 (×15): 3.375 g via INTRAVENOUS
  Filled 2019-11-05 (×15): qty 50

## 2019-11-05 MED ORDER — DEXTROSE-NACL 5-0.9 % IV SOLN: INTRAVENOUS | Status: DC

## 2019-11-05 MED ORDER — PANTOPRAZOLE SODIUM 40 MG IV SOLR
40.0000 mg | INTRAVENOUS | Status: DC
  Administered 2019-11-05 – 2019-11-10 (×6): 40 mg via INTRAVENOUS
  Filled 2019-11-05 (×6): qty 40

## 2019-11-05 MED ORDER — MORPHINE SULFATE (PF) 2 MG/ML IV SOLN
2.0000 mg | INTRAVENOUS | Status: DC | PRN
  Administered 2019-11-07 (×2): 2 mg via INTRAVENOUS
  Filled 2019-11-05 (×2): qty 1

## 2019-11-05 MED ORDER — GADOBUTROL 1 MMOL/ML IV SOLN
7.0000 mL | Freq: Once | INTRAVENOUS | Status: AC | PRN
  Administered 2019-11-05: 7 mL via INTRAVENOUS
  Filled 2019-11-05: qty 7.5

## 2019-11-05 MED ORDER — INSULIN ASPART 100 UNIT/ML ~~LOC~~ SOLN
0.0000 [IU] | SUBCUTANEOUS | Status: DC
  Administered 2019-11-05: 3 [IU] via SUBCUTANEOUS
  Administered 2019-11-05: 2 [IU] via SUBCUTANEOUS
  Administered 2019-11-06: 3 [IU] via SUBCUTANEOUS
  Administered 2019-11-06: 5 [IU] via SUBCUTANEOUS
  Administered 2019-11-06: 3 [IU] via SUBCUTANEOUS
  Administered 2019-11-06 – 2019-11-07 (×5): 2 [IU] via SUBCUTANEOUS
  Administered 2019-11-07: 1 [IU] via SUBCUTANEOUS
  Administered 2019-11-07: 3 [IU] via SUBCUTANEOUS
  Administered 2019-11-07 – 2019-11-08 (×2): 2 [IU] via SUBCUTANEOUS
  Administered 2019-11-08 (×3): 5 [IU] via SUBCUTANEOUS
  Administered 2019-11-08: 2 [IU] via SUBCUTANEOUS
  Administered 2019-11-09 (×2): 5 [IU] via SUBCUTANEOUS
  Administered 2019-11-09 (×2): 2 [IU] via SUBCUTANEOUS
  Administered 2019-11-09: 3 [IU] via SUBCUTANEOUS
  Administered 2019-11-09 – 2019-11-10 (×2): 2 [IU] via SUBCUTANEOUS
  Administered 2019-11-10: 5 [IU] via SUBCUTANEOUS
  Administered 2019-11-10: 2 [IU] via SUBCUTANEOUS
  Administered 2019-11-10: 3 [IU] via SUBCUTANEOUS
  Filled 2019-11-05 (×27): qty 1

## 2019-11-05 MED ORDER — TERAZOSIN HCL 2 MG PO CAPS
2.0000 mg | ORAL_CAPSULE | Freq: Every day | ORAL | Status: DC
  Administered 2019-11-05 – 2019-11-10 (×6): 2 mg via ORAL
  Filled 2019-11-05 (×6): qty 1

## 2019-11-05 MED ORDER — PIPERACILLIN-TAZOBACTAM 3.375 G IVPB 30 MIN
3.3750 g | Freq: Once | INTRAVENOUS | Status: AC
  Administered 2019-11-05: 3.375 g via INTRAVENOUS
  Filled 2019-11-05: qty 50

## 2019-11-05 NOTE — Consult Note (Signed)
Cephas Darby, MD [address]  [place]  [place], [place] [address]  Main: [phone]  Fax: [phone] Pager: [phone]   Consultation  Referring Provider:     No ref. provider found Primary Care Physician:  Lucille Passy, [place] Primary Gastroenterologist: Althia Forts         Reason for Consultation:     Elevated bilirubin, evaluate for choledocho  Date of Admission:  11/05/2019 Date of Consultation:  11/05/2019         HPI:   Andrew Watts is a 85 y.o. male with history of A. fib on Eliquis, CHF, hypertension, history of coronary disease, diabetes who presented to ER yesterday with vomiting for 2 days as well as upper abdominal pain, radiating to right upper quadrant.  History of subjective fevers and chills.  Vitals were stable.  Labs revealed no leukocytosis, hemoglobin 14.4, lipase normal, significantly elevated LFTs AST 1392, ALT 657, alkaline phosphatase 197, total bilirubin 4.4.  Patient subsequently underwent right upper quadrant ultrasound which revealed thickened gallbladder wall with cholelithiasis.  Normal CBD measuring 5 mm.  Doppler showed patent portal vein surgery is consulted who requested GI consult to rule out choledocholithiasis.  Patient is started on antibiotics, IV fluids and kept n.p.o.  Patient reports that he is no longer experiencing abdominal pain, nausea and vomiting.  His wife is bedside.  He is asking if he can try something to eat  NSAIDs: None  Antiplts/Anticoagulants/Anti thrombotics: Eliquis for history of A. fib   Past Medical History:  Diagnosis Date  . CHF (congestive heart failure) ([facility])   . Chronic kidney disease    told 7-8 yrs ago. kidney function only 35%  . Diabetes mellitus without complication ([facility])   . Hypercholesteremia   . Hypertension   . Myocardial infarction ([facility]) 1986   x2  . Wears dentures    full upper and lower    Past Surgical History:  Procedure Laterality Date  . CARDIAC CATHETERIZATION  1986    stent placed  . CATARACT EXTRACTION W/PHACO Right 02/23/2016   Procedure: CATARACT EXTRACTION PHACO AND INTRAOCULAR LENS PLACEMENT (IOC);  Surgeon: Eulogio Bear, MD;  Location: [facility];  Service: Ophthalmology;  Laterality: Right;  RIGHT DIABETIC - oral meds  . CATARACT EXTRACTION W/PHACO Left 04/05/2016   Procedure: CATARACT EXTRACTION PHACO AND INTRAOCULAR LENS PLACEMENT (IOC);  Surgeon: Eulogio Bear, MD;  Location: [facility];  Service: Ophthalmology;  Laterality: Left;  LEFT DIABETES - oral meds IVA TOPICAL  . [facility]     [facility]    Prior to Admission medications   Medication Sig Start Date End Date Taking? Authorizing Provider  amiodarone (PACERONE) 200 MG tablet One tab twice a day for 7 days then one tab po daily afterwards 05/17/19   Loletha Grayer, MD  apixaban (ELIQUIS) 2.5 MG TABS tablet Take 1 tablet (2.5 mg total) by mouth 2 (two) times daily. 05/17/19   Loletha Grayer, MD  finasteride (PROSCAR) 5 MG tablet Take 5 mg by mouth daily.    [provider]  furosemide (LASIX) 40 MG tablet Take 1 tablet (40 mg total) by mouth daily. 05/23/19   Alisa Graff, FNP  linagliptin (TRADJENTA) 5 MG TABS tablet Take 1 tablet (5 mg total) by mouth daily. 05/17/19   Loletha Grayer, MD  metoprolol tartrate (LOPRESSOR) 100 MG tablet Take 1 tablet (100 mg total) by mouth 2 (two) times daily. 05/17/19   Loletha Grayer, MD  Nutritional Supplements (FEEDING  SUPPLEMENT, NEPRO CARB STEADY,) LIQD Take 237 mLs by mouth 2 (two) times daily between meals. 05/17/19   Loletha Grayer, MD  simvastatin (ZOCOR) 20 MG tablet Take 20 mg by mouth daily.    [provider]  terazosin (HYTRIN) 2 MG capsule Take 2 mg by mouth daily.    [provider]   Current Facility-Administered Medications:  .  amiodarone (PACERONE) tablet 200 mg, 200 mg, Oral, Daily, Agbata, Tochukwu, MD, 200 mg at 11/05/19 1054 .  dextrose 5 %-0.9 % sodium chloride infusion, ,  Intravenous, Continuous, Agbata, Tochukwu, MD .  finasteride (PROSCAR) tablet 5 mg, 5 mg, Oral, Daily, Agbata, Tochukwu, MD, 5 mg at 11/05/19 1055 .  insulin aspart (novoLOG) injection 0-9 Units, 0-9 Units, Subcutaneous, Q4H, Agbata, Tochukwu, MD .  metoprolol tartrate (LOPRESSOR) tablet 100 mg, 100 mg, Oral, BID, Agbata, Tochukwu, MD .  morphine 2 MG/ML injection 2 mg, 2 mg, Intravenous, Q4H PRN, Agbata, Tochukwu, MD .  ondansetron (ZOFRAN) tablet 4 mg, 4 mg, Oral, Q6H PRN **OR** ondansetron (ZOFRAN) injection 4 mg, 4 mg, Intravenous, Q6H PRN, Agbata, Tochukwu, MD .  pantoprazole (PROTONIX) injection 40 mg, 40 mg, Intravenous, Q24H, Agbata, Tochukwu, MD, 40 mg at 11/05/19 1057 .  piperacillin-tazobactam (ZOSYN) IVPB 3.375 g, 3.375 g, Intravenous, Q8H, Agbata, Tochukwu, MD, Last Rate: 12.5 mL/hr at 11/05/19 1426, 3.375 g at 11/05/19 1426 .  terazosin (HYTRIN) capsule 2 mg, 2 mg, Oral, Daily, Agbata, Tochukwu, MD, 2 mg at 11/05/19 1055  Current Outpatient Medications:  .  amiodarone (PACERONE) 200 MG tablet, Take 200 mg by mouth daily., Disp: , Rfl:  .  amLODipine (NORVASC) 10 MG tablet, Take 10 mg by mouth daily., Disp: , Rfl:  .  apixaban (ELIQUIS) 2.5 MG TABS tablet, Take 1 tablet (2.5 mg total) by mouth 2 (two) times daily., Disp: 60 tablet, Rfl: 0 .  empagliflozin (JARDIANCE) 25 MG TABS tablet, Take 12.5 mg by mouth daily., Disp: , Rfl:  .  ferrous sulfate 325 (65 FE) MG tablet, Take 325 mg by mouth every other day., Disp: , Rfl:  .  finasteride (PROSCAR) 5 MG tablet, Take 5 mg by mouth daily., Disp: , Rfl:  .  furosemide (LASIX) 40 MG tablet, Take 1 tablet (40 mg total) by mouth daily., Disp: 30 tablet, Rfl: 5 .  lisinopril (ZESTRIL) 10 MG tablet, Take 5 mg by mouth daily., Disp: , Rfl:  .  metoprolol tartrate (LOPRESSOR) 100 MG tablet, Take 1 tablet (100 mg total) by mouth 2 (two) times daily., Disp: 60 tablet, Rfl: 0 .  Nutritional Supplements (FEEDING SUPPLEMENT, NEPRO CARB STEADY,) LIQD,  Take 237 mLs by mouth 2 (two) times daily between meals., Disp: 14220 mL, Rfl: 0 .  simvastatin (ZOCOR) 20 MG tablet, Take 20 mg by mouth daily., Disp: , Rfl:  .  terazosin (HYTRIN) 2 MG capsule, Take 2 mg by mouth daily., Disp: , Rfl:    No family history on file.   Social History   Tobacco Use  . Smoking status: Former Smoker    Quit date: 03/28/1985    Years since quitting: 34.6  . Smokeless tobacco: Never Used  Vaping Use  . Vaping Use: Never used  Substance Use Topics  . Alcohol use: No  . Drug use: Not Currently    Allergies as of 11/04/2019  . (No Known Allergies)    Review of Systems:    All systems reviewed and negative except where noted in HPI.   Physical Exam:  Vital signs  in last 24 hours: Temp:  [98.1 F (36.7 C)-99.3 F (37.4 C)] 99.3 F (37.4 C) (08/09 2316) Pulse Rate:  [61-70] 70 (08/10 1235) Resp:  [13-18] 18 (08/10 1235) BP: (110-159)/(50-77) 145/74 (08/10 1235) SpO2:  [96 %-100 %] 96 % (08/10 1235) Weight:  [70.3 kg] 70.3 kg (08/09 2316)   General:   Pleasant, cooperative in NAD Head:  Normocephalic and atraumatic. Eyes: Positive icterus.   Conjunctiva pink. PERRLA. Ears:  Normal auditory acuity. Neck:  Supple; no masses or thyroidomegaly Lungs: Respirations even and unlabored. Lungs clear to auscultation bilaterally.   No wheezes, crackles, or rhonchi.  Heart:  Regular rate and rhythm;  Without murmur, clicks, rubs or gallops Abdomen:  Soft, nondistended, nontender. Normal bowel sounds. No appreciable masses or hepatomegaly.  No rebound or guarding.  Rectal:  Not performed. Msk:  Symmetrical without gross deformities.  Strength appropriate for his age Extremities:  Without edema, cyanosis or clubbing. Neurologic:  Alert and oriented x3;  grossly normal neurologically. Skin:  Intact without significant lesions or rashes. Psych:  Alert and cooperative. Normal affect.  LAB RESULTS: CBC Latest Ref Rng & Units 11/04/2019 05/15/2019 05/14/2019  WBC  4.0 - 10.5 K/uL 9.9 8.0 9.0  Hemoglobin 13.0 - 17.0 g/dL 14.4 11.0(L) 11.7(L)  Hematocrit 39 - 52 % 43.1 33.3(L) 35.6(L)  Platelets 150 - 400 K/uL 119(L) 127(L) 122(L)    BMET BMP Latest Ref Rng & Units 11/04/2019 05/17/2019 05/16/2019  Glucose 70 - 99 mg/dL 246(H) 207(H) 198(H)  BUN 8 - 23 mg/dL 59(H) 47(H) 40(H)  Creatinine 0.61 - 1.24 mg/dL 2.57(H) 1.95(H) 1.61(H)  Sodium 135 - 145 mmol/L 131(L) 137 139  Potassium 3.5 - 5.1 mmol/L 3.7 3.7 3.5  Chloride 98 - 111 mmol/L 92(L) 101 106  CO2 22 - 32 mmol/L 22 25 24   Calcium 8.9 - 10.3 mg/dL 8.9 9.2 9.0    LFT Hepatic Function Latest Ref Rng & Units 11/04/2019 05/14/2019  Total Protein 6.5 - 8.1 g/dL 8.0 7.2  Albumin 3.5 - 5.0 g/dL 3.9 3.8  AST 15 - 41 U/L 1,392(H) 13(L)  ALT 0 - 44 U/L 657(H) 16  Alk Phosphatase 38 - 126 U/L 197(H) 69  Total Bilirubin 0.3 - 1.2 mg/dL 4.4(H) 0.8     STUDIES: MR ABDOMEN MRCP W WO CONTAST  Result Date: 11/05/2019 CLINICAL DATA:  Abdominal pain with suspected biliary obstruction. EXAM: MRI ABDOMEN WITHOUT AND WITH CONTRAST (INCLUDING MRCP) TECHNIQUE: Multiplanar multisequence MR imaging of the abdomen was performed both before and after the administration of intravenous contrast. Heavily T2-weighted images of the biliary and pancreatic ducts were obtained, and three-dimensional MRCP images were rendered by post processing. CONTRAST:  81m GADAVIST GADOBUTROL 1 MMOL/ML IV SOLN COMPARISON:  Ultrasound exam 11/05/2019. FINDINGS: Lower chest: Dependent atelectasis noted in the lower lobes. Hepatobiliary: No suspicious focal abnormality within the liver parenchyma. Gallbladder is distended with irregular diffuse gallbladder wall thickening and pericholecystic edema. Gallstones seen on ultrasound are not readily evident by MRI. No intra or extrahepatic biliary duct dilatation. No choledocholithiasis. Pancreas: 8 mm simple cystic lesion is identified in the pancreas near the junction of the body and tail. There is a  second tiny 4 mm cyst in the body of pancreas. No dilatation of the main duct. Spleen:  No splenomegaly. No focal mass lesion. Adrenals/Urinary Tract: No adrenal nodule or mass. Kidneys unremarkable. Stomach/Bowel: Stomach is unremarkable. No gastric wall thickening. No evidence of outlet obstruction. Duodenum is normally positioned as is the ligament of Treitz. No  small bowel or colonic dilatation within the visualized abdomen. Vascular/Lymphatic: No abdominal aortic aneurysm There is no gastrohepatic or hepatoduodenal ligament lymphadenopathy. No retroperitoneal or mesenteric lymphadenopathy. Other:  No intraperitoneal free fluid. Musculoskeletal: No focal suspicious marrow enhancement within the visualized bony anatomy. IMPRESSION: 1. Gallbladder distension with diffuse, irregular gallbladder wall thickening and pericholecystic edema. Although no gallstones are evident by MRI, several were identified on recent ultrasound earlier today. 2. No intra or extrahepatic biliary duct dilatation. No evidence of choledocholithiasis. Of note, if the gallstones seen on ultrasound are in apparent by MRI, ductal stones may also be occult. 3. 2 tiny cystic lesions in the pancreas measuring 4 and 8 mm. These are likely benign. Consensus guidelines suggest that for cystic lesions of this size in a patient of this age, repeat imaging in 2 years is warranted. This recommendation follows ACR consensus guidelines: Management of Incidental Pancreatic Cysts: A White Paper of the ACR Incidental Findings Committee. [place] [id number];10:[id number]. Electronically Signed   By: Misty Stanley M.D.   On: 11/05/2019 12:46   US ABDOMEN LIMITED RUQ  Result Date: 11/05/2019 CLINICAL DATA:  Transaminitis EXAM: ULTRASOUND ABDOMEN LIMITED RIGHT UPPER QUADRANT COMPARISON:  None. FINDINGS: Gallbladder: Thickened gallbladder wall is seen measuring up to 8.5 mm. Layering small stones or sludge is seen. The largest calculi measures 6 mm. No  sonographic Percell Miller sign is seen. Possible trace pericholecystic fluid. Common bile duct: Diameter: 5 mm Liver: No focal lesion identified. Within normal limits in parenchymal echogenicity. Portal vein is patent on color Doppler imaging with normal direction of blood flow towards the liver. Other: None. IMPRESSION: Findings which could be suggestive of acute cholecystitis Electronically Signed   By: Prudencio Pair M.D.   On: 11/05/2019 00:41      Impression / Plan:   Andrew Watts is a 85 y.o. male with history of A. fib on Eliquis presented with right upper quadrant pain with nausea and vomiting.  Labs revealed significantly elevated transaminases and elevated bilirubin.  Ultrasound revealed cholelithiasis with acute cholecystitis and normal caliber CBD.  Very less likely choledocholithiasis.  However, due to elevated alkaline phosphatase and bilirubin, recommend MRCP for further evaluation.  Monitor LFTs closely Surgery is on board, antibiotics have been started Check viral hepatitis panel  Thank you for involving me in the care of this patient.      LOS: 0 days   Sherri Sear, MD  11/05/2019, 4:03 PM   Note: This dictation was prepared with Dragon dictation along with smaller phrase technology. Any transcriptional errors that result from this process are unintentional.

## 2019-11-05 NOTE — Progress Notes (Signed)
MRCP is negative for choledocholithiasis No indication for ERCP at this time Monitor LFTs Commenced on antibiotics Defer timing of cholecystectomy to general surgery Okay with clear liquid diet  Lannette Donath, MD

## 2019-11-05 NOTE — ED Notes (Addendum)
Dr. Manson Passey at the bedside to discuss diagnosis and plan of care

## 2019-11-05 NOTE — Consult Note (Signed)
SURGICAL CONSULTATION NOTE   HISTORY OF PRESENT ILLNESS (HPI):  85 y.o. male presented to [facility] ED for evaluation of abdominal pain since 4 days ago. Patient reports upper abdominal pain and nausea.  She reported the pain radiated to the right upper quadrant.  He reports having fever, unquantified.  There has been no alleviating or aggravating factors identified.  Patient came to the ED and labs shows no leukocytosis but with elevated AST/ALT, bilirubin and alkaline phosphatase.  Abdominal ultrasound shows stones in the gallbladder with gallbladder wall thickening.  I personally evaluated the images.  Surgery is consulted by Dr. Manson Passey in this context for evaluation and management of cholecystitis.  PAST MEDICAL HISTORY (PMH):  Past Medical History:  Diagnosis Date   CHF (congestive heart failure) ([facility])    Chronic kidney disease    told 7-8 yrs ago. kidney function only 35%   Diabetes mellitus without complication ([facility])    Hypercholesteremia    Hypertension    Myocardial infarction ([facility]) 1986   x2   Wears dentures    full upper and lower     PAST SURGICAL HISTORY (PSH):  Past Surgical History:  Procedure Laterality Date   CARDIAC CATHETERIZATION  1986   stent placed   CATARACT EXTRACTION W/PHACO Right 02/23/2016   Procedure: CATARACT EXTRACTION PHACO AND INTRAOCULAR LENS PLACEMENT (IOC);  Surgeon: Nevada Crane, MD;  [facility];  Service: Ophthalmology;  Laterality: Right;  RIGHT DIABETIC - oral meds   CATARACT EXTRACTION W/PHACO Left 04/05/2016   Procedure: CATARACT EXTRACTION PHACO AND INTRAOCULAR LENS PLACEMENT (IOC);  Surgeon: Nevada Crane, MD;  Location: [facility];  Service: Ophthalmology;  Laterality: Left;  LEFT DIABETES - oral meds IVA TOPICAL   [facility]     [facility]     MEDICATIONS:  Prior to Admission medications   Medication Sig Start Date End Date Taking? Authorizing Provider  amiodarone (PACERONE) 200 MG tablet One  tab twice a day for 7 days then one tab po daily afterwards 05/17/19   Alford Highland, MD  apixaban (ELIQUIS) 2.5 MG TABS tablet Take 1 tablet (2.5 mg total) by mouth 2 (two) times daily. 05/17/19   Alford Highland, MD  finasteride (PROSCAR) 5 MG tablet Take 5 mg by mouth daily.    [provider]  furosemide (LASIX) 40 MG tablet Take 1 tablet (40 mg total) by mouth daily. 05/23/19   Delma Freeze, FNP  linagliptin (TRADJENTA) 5 MG TABS tablet Take 1 tablet (5 mg total) by mouth daily. 05/17/19   Alford Highland, MD  metoprolol tartrate (LOPRESSOR) 100 MG tablet Take 1 tablet (100 mg total) by mouth 2 (two) times daily. 05/17/19   Alford Highland, MD  Nutritional Supplements (FEEDING SUPPLEMENT, NEPRO CARB STEADY,) LIQD Take 237 mLs by mouth 2 (two) times daily between meals. 05/17/19   Alford Highland, MD  simvastatin (ZOCOR) 20 MG tablet Take 20 mg by mouth daily.    [provider]  terazosin (HYTRIN) 2 MG capsule Take 2 mg by mouth daily.    [provider]     ALLERGIES:  Allergies  Allergen Reactions   Pioglitazone Shortness Of Breath     SOCIAL HISTORY:  Social History   Socioeconomic History   Marital status: Married    Spouse name: Not on file   Number of children: Not on file   Years of education: Not on file   Highest education level: Not on file  Occupational History   Not on  file  Tobacco Use   Smoking status: Former Smoker    Quit date: 03/28/1985    Years since quitting: 34.6   Smokeless tobacco: Never Used  Vaping Use   Vaping Use: Never used  Substance and Sexual Activity   Alcohol use: No   Drug use: Not Currently   Sexual activity: Not Currently  Other Topics Concern   Not on file  Social History Narrative   Not on file   Social Determinants of Health   Financial Resource Strain:    Difficulty of Paying Living Expenses:   Food Insecurity:    Worried About Programme researcher, broadcasting/film/video in the Last Year:    Garment/textile technologist in the Last Year:   Transportation Needs:    Freight forwarder (Medical):    Lack of Transportation (Non-Medical):   Physical Activity:    Days of Exercise per Week:    Minutes of Exercise per Session:   Stress:    Feeling of Stress :   Social Connections:    Frequency of Communication with Friends and Family:    Frequency of Social Gatherings with Friends and Family:    Attends Religious Services:    Active Member of Clubs or Organizations:    Attends Engineer, structural:    Marital Status:   Intimate Partner Violence:    Fear of Current or Ex-Partner:    Emotionally Abused:    Physically Abused:    Sexually Abused:       FAMILY HISTORY:  No family history on file.   REVIEW OF SYSTEMS:  Constitutional: denies weight loss, chills, or sweats.  Positive for fever Eyes: denies any other vision changes, history of eye injury  ENT: denies sore throat, hearing problems  Respiratory: denies shortness of breath, wheezing  Cardiovascular: denies chest pain, palpitations  Gastrointestinal: Positive abdominal pain, nausea  Genitourinary: denies burning with urination or urinary frequency Musculoskeletal: denies any other joint pains or cramps  Skin: denies any other rashes or skin discolorations  Neurological: denies any other headache, dizziness, weakness  Psychiatric: denies any other depression, anxiety   All other review of systems were negative   VITAL SIGNS:  Temp:  [98.1 F (36.7 C)-99.3 F (37.4 C)] 99.3 F (37.4 C) (08/09 2316) Pulse Rate:  [61-69] 61 (08/10 0533) Resp:  [16-18] 16 (08/10 0533) BP: (110-159)/(50-77) 159/77 (08/10 0533) SpO2:  [96 %-100 %] 100 % (08/10 0533) Weight:  [70.3 kg] 70.3 kg (08/09 2316)     Height: 5\' 11"  (180.3 cm) Weight: 70.3 kg BMI (Calculated): 21.63   INTAKE/OUTPUT:  This shift: No intake/output data recorded.  Last 2 shifts: @IOLAST2SHIFTS @   PHYSICAL EXAM:  Constitutional:  -- Normal body  habitus  -- Awake, alert, and oriented x3  Eyes:  -- Pupils equally round and reactive to light  -- No scleral icterus  Ear, nose, and throat:  -- No jugular venous distension  Pulmonary:  -- No crackles  -- Equal breath sounds bilaterally -- Breathing non-labored at rest Cardiovascular:  -- S1, S2 present  -- No pericardial rubs --Irregular rhythm Gastrointestinal:  -- Abdomen soft, mild tender in right upper quadrant, non-distended, no guarding or rebound tenderness -- No abdominal masses appreciated, pulsatile or otherwise  Musculoskeletal and Integumentary:  -- Wounds: None appreciated -- Extremities: B/L UE and LE FROM, hands and feet warm, no edema  Neurologic:  -- Motor function: intact and symmetric -- Sensation: intact and symmetric   Labs:  CBC Latest  Ref Rng & Units 11/04/2019 05/15/2019 05/14/2019  WBC 4.0 - 10.5 K/uL 9.9 8.0 9.0  Hemoglobin 13.0 - 17.0 g/dL 70.3 11.0(L) 11.7(L)  Hematocrit 39 - 52 % 43.1 33.3(L) 35.6(L)  Platelets 150 - 400 K/uL 119(L) 127(L) 122(L)   CMP Latest Ref Rng & Units 11/04/2019 05/17/2019 05/16/2019  Glucose 70 - 99 mg/dL 500(X) 381(W) 299(B)  BUN 8 - 23 mg/dL 71(I) 96(V) 89(F)  Creatinine 0.61 - 1.24 mg/dL 8.10(F) 7.51(W) 2.58(N)  Sodium 135 - 145 mmol/L 131(L) 137 139  Potassium 3.5 - 5.1 mmol/L 3.7 3.7 3.5  Chloride 98 - 111 mmol/L 92(L) 101 106  CO2 22 - 32 mmol/L 22 25 24   Calcium 8.9 - 10.3 mg/dL 8.9 9.2 9.0  Total Protein 6.5 - 8.1 g/dL 8.0 - -  Total Bilirubin 0.3 - 1.2 mg/dL 4.4(H) - -  Alkaline Phos 38 - 126 U/L 197(H) - -  AST 15 - 41 U/L 1,392(H) - -  ALT 0 - 44 U/L 657(H) - -     Imaging studies:  EXAM: ULTRASOUND ABDOMEN LIMITED RIGHT UPPER QUADRANT  COMPARISON:  None.  FINDINGS: Gallbladder:  Thickened gallbladder wall is seen measuring up to 8.5 mm. Layering small stones or sludge is seen. The largest calculi measures 6 mm. No sonographic sign is seen. Possible trace  pericholecystic fluid.  Common bile duct:  Diameter: 5 mm  Liver:  No focal lesion identified. Within normal limits in parenchymal echogenicity. Portal vein is patent on color Doppler imaging with normal direction of blood flow towards the liver.  Other: None.  IMPRESSION: Findings which could be suggestive of acute cholecystitis   Electronically Signed   By: Eulah Pont M.D.   On: 11/05/2019 00:41  Assessment/Plan:  85 y.o. male with acute cholecystitis, complicated by pertinent comorbidities including possible choledocholithiasis, CHF, atrial fibrillation on Eliquis, chronic kidney disease stage IIIb.  Patient with cholecystitis with onset of at least 4 days.  Patient also with elevated liver enzymes with highly suspicious of choledocholithiasis.  MRCP has been ordered and pending.  I agree with MRCP for rule out stone or sludge in the common bile duct.  If this is positive patient will need evaluation by GI.  At this moment I consider that the best management will be conservative management with IV antibiotic therapy for the cholecystitis.  If the patient does not improve, I would recommend percutaneous cholecystostomy due to patient high risk for surgery in the acute setting plus onset of pain and inflammation of more than 72 hours.  Agree with current management at this moment.  I will continue to follow along.  91, MD

## 2019-11-05 NOTE — ED Notes (Signed)
Pt sleeping with audible respirations and equal chest rise and fall.

## 2019-11-05 NOTE — H&P (Signed)
History and Physical    Andrew Watts BOF:[id number] DOB: 1934/08/16 DOA: 11/05/2019  PCP: Noel Gerold, PA   Patient coming from: Home  I have personally briefly reviewed patient's old medical records in [facility]  Chief Complaint: Nausea/vomiting  HPI: Andrew Watts is a 85 y.o. male with medical history significant for diabetes mellitus, coronary artery disease, CHF who was referred to the emergency room from the urgent care center for evaluation of nausea, vomiting and inability to keep any food down associated with pain in the right upper quadrant.  Patient has had symptoms for 2 days and sought medical help due to the refractory nature of his symptoms.  He rated his right upper quadrant pain a 6 x 10 in intensity at its worst.  He denied having any fever or chills.  He denied having any changes in his bowel habits, no chest pain, no shortness of breath, no palpitations or diaphoresis.  He denied having any cough or urinary symptoms. General evaluation patient appeared comfortable and stated that his pain had improved. Labs reveal a sodium of 131, glucose of 246, BUN of 59, creatinine of 2.57, AST 1392, ALT 657, white count of 9.9 with a left shift and hemoglobin of 14. Abdominal ultrasound showed findings suggestive of acute cholecystitis Twelve-lead EKG reviewed by me showed sinus rhythm with PVCs   ED Course: Patient is an 85 year old Caucasian male who was sent to the emergency room from urgent care for evaluation of right upper quadrant pain associated with nausea, vomiting and inability to tolerate oral intake.  Patient noted to have marked transaminitis and ultrasound suggestive of acute cholecystitis.  He will be admitted to the hospital for further evaluation.  Review of Systems: As per HPI otherwise 10 point review of systems negative.    Past Medical History:  Diagnosis Date  . CHF (congestive heart failure) (HCC)   . Chronic kidney disease    told 7-8 yrs ago.  kidney function only 35%  . Diabetes mellitus without complication (HCC)   . Hypercholesteremia   . Hypertension   . Myocardial infarction (HCC) 1986   x2  . Wears dentures    full upper and lower    Past Surgical History:  Procedure Laterality Date  . CARDIAC CATHETERIZATION  1986   stent placed  . CATARACT EXTRACTION W/PHACO Right 02/23/2016   Procedure: CATARACT EXTRACTION PHACO AND INTRAOCULAR LENS PLACEMENT (IOC);  Surgeon: Nevada Crane, MD;  Location: [facility];  Service: Ophthalmology;  Laterality: Right;  RIGHT DIABETIC - oral meds  . CATARACT EXTRACTION W/PHACO Left 04/05/2016   Procedure: CATARACT EXTRACTION PHACO AND INTRAOCULAR LENS PLACEMENT (IOC);  Surgeon: Nevada Crane, MD;  Location: [facility];  Service: Ophthalmology;  Laterality: Left;  LEFT DIABETES - oral meds IVA TOPICAL  . [facility]E [facility]     [facility]     reports that he quit smoking about 34 years ago. He has never used smokeless tobacco. He reports previous drug use. He reports that he does not drink alcohol.  Allergies  Allergen Reactions  . Pioglitazone Shortness Of Breath    No family history on file.   Prior to Admission medications   Medication Sig Start Date End Date Taking? Authorizing Provider  amiodarone (PACERONE) 200 MG tablet One tab twice a day for 7 days then one tab po daily afterwards 05/17/19   Alford Highland, MD  apixaban (ELIQUIS) 2.5 MG TABS tablet Take 1 tablet (2.5 mg total) by mouth 2 (two) times  daily. 05/17/19   Alford Highland, MD  finasteride (PROSCAR) 5 MG tablet Take 5 mg by mouth daily.    [provider]  furosemide (LASIX) 40 MG tablet Take 1 tablet (40 mg total) by mouth daily. 05/23/19   Delma Freeze, FNP  linagliptin (TRADJENTA) 5 MG TABS tablet Take 1 tablet (5 mg total) by mouth daily. 05/17/19   Alford Highland, MD  metoprolol tartrate (LOPRESSOR) 100 MG tablet Take 1 tablet (100 mg total) by mouth 2 (two) times daily. 05/17/19    Alford Highland, MD  Nutritional Supplements (FEEDING SUPPLEMENT, NEPRO CARB STEADY,) LIQD Take 237 mLs by mouth 2 (two) times daily between meals. 05/17/19   Alford Highland, MD  simvastatin (ZOCOR) 20 MG tablet Take 20 mg by mouth daily.    [provider]  terazosin (HYTRIN) 2 MG capsule Take 2 mg by mouth daily.    [provider]    Physical Exam: Vitals:   11/04/19 2316 11/05/19 0243 11/05/19 0533  BP: (!) 113/50 (!) 133/59 (!) 159/77  Pulse: 68 63 61  Resp: 16 16 16   Temp: 99.3 F (37.4 C)    TempSrc: Oral    SpO2: 97% 96% 100%  Weight: 70.3 kg    Height: 5\' 11"  (1.803 m)       Vitals:   11/04/19 2316 11/05/19 0243 11/05/19 0533  BP: (!) 113/50 (!) 133/59 (!) 159/77  Pulse: 68 63 61  Resp: 16 16 16   Temp: 99.3 F (37.4 C)    TempSrc: Oral    SpO2: 97% 96% 100%  Weight: 70.3 kg    Height: 5\' 11"  (1.803 m)      Constitutional: NAD, alert and oriented x 3.  Appears comfortable and in no distress.  Looks younger than stated age Eyes: PERRL, lids and conjunctivae normal ENMT: Mucous membranes are dry.  Neck: normal, supple, no masses, no thyromegaly Respiratory: clear to auscultation bilaterally, no wheezing, no crackles. Normal respiratory effort. No accessory muscle use.  Cardiovascular: Regular rate and rhythm,no murmurs / rubs / gallops. No extremity edema. 2+ pedal pulses. No carotid bruits.  Abdomen: RUQ tenderness, no masses palpated. No hepatosplenomegaly. Bowel sounds positive.  Musculoskeletal: no clubbing / cyanosis. No joint deformity upper and lower extremities.  Skin: no rashes, lesions, ulcers.  Neurologic: No gross focal neurologic deficit. Psychiatric: Normal mood and affect.   Labs on Admission: I have personally reviewed following labs and imaging studies  CBC: Recent Labs  Lab 11/04/19 2051  WBC 9.9  NEUTROABS 9.0*  HGB 14.4  HCT 43.1  MCV 93.3  PLT 119*   Basic Metabolic Panel: Recent Labs  Lab 11/04/19 2051  NA  131*  K 3.7  CL 92*  CO2 22  GLUCOSE 246*  BUN 59*  CREATININE 2.57*  CALCIUM 8.9   GFR: Estimated Creatinine Clearance: 21.7 mL/min (A) (by C-G formula based on SCr of 2.57 mg/dL (H)). Liver Function Tests: Recent Labs  Lab 11/04/19 2051  AST 1,392*  ALT 657*  ALKPHOS 197*  BILITOT 4.4*  PROT 8.0  ALBUMIN 3.9   Recent Labs  Lab 11/04/19 2326  LIPASE 21   No results for input(s): AMMONIA in the last 168 hours. Coagulation Profile: Recent Labs  Lab 11/04/19 2326  INR 1.7*   Cardiac Enzymes: No results for input(s): CKTOTAL, CKMB, CKMBINDEX, TROPONINI in the last 168 hours. BNP (last 3 results) No results for input(s): PROBNP in the last 8760 hours. HbA1C: No results for input(s): HGBA1C in the  last 72 hours. CBG: No results for input(s): GLUCAP in the last 168 hours. Lipid Profile: No results for input(s): CHOL, HDL, LDLCALC, TRIG, CHOLHDL, LDLDIRECT in the last 72 hours. Thyroid Function Tests: No results for input(s): TSH, T4TOTAL, FREET4, T3FREE, THYROIDAB in the last 72 hours. Anemia Panel: No results for input(s): VITAMINB12, FOLATE, FERRITIN, TIBC, IRON, RETICCTPCT in the last 72 hours. Urine analysis: No results found for: COLORURINE, APPEARANCEUR, LABSPEC, PHURINE, GLUCOSEU, HGBUR, BILIRUBINUR, KETONESUR, PROTEINUR, UROBILINOGEN, NITRITE, LEUKOCYTESUR  Radiological Exams on Admission: US ABDOMEN LIMITED RUQ  Result Date: 11/05/2019 CLINICAL DATA:  Transaminitis EXAM: ULTRASOUND ABDOMEN LIMITED RIGHT UPPER QUADRANT COMPARISON:  None. FINDINGS: Gallbladder: Thickened gallbladder wall is seen measuring up to 8.5 mm. Layering small stones or sludge is seen. The largest calculi measures 6 mm. No sonographic Eulah Pont sign is seen. Possible trace pericholecystic fluid. Common bile duct: Diameter: 5 mm Liver: No focal lesion identified. Within normal limits in parenchymal echogenicity. Portal vein is patent on color Doppler imaging with normal direction of blood  flow towards the liver. Other: None. IMPRESSION: Findings which could be suggestive of acute cholecystitis Electronically Signed   By: Jonna Clark M.D.   On: 11/05/2019 00:41    EKG: Independently reviewed.  Sinus rhythm PVCs  Assessment/Plan Principal Problem:   Acute cholecystitis Active Problems:   Type 2 diabetes mellitus with stage 3 chronic kidney disease (HCC)   AF (paroxysmal atrial fibrillation) (HCC)   Chronic systolic CHF (congestive heart failure) (HCC)   Dehydration, mild   Thrombocytopenia (HCC)     Acute cholecystitis Place patient on IV antibiotic therapy with Zosyn adjusted to renal function Supportive care with IV antiemetics, pain control and IV PPI Follow-up results of blood cultures We will consult GI for marked transaminitis.  Patient is scheduled for MRCP Request surgical consult   Diabetes mellitus with complications of stage III chronic kidney disease We will keep patient n.p.o. except for medications Glycemic control with sliding scale insulin   Chronic systolic CHF Patient's last 2D echocardiogram from 2021 showed an LVEF of 35 to 40% Hold diuretics for now we will monitor patient closely for signs and symptoms of respiratory distress Continue metoprolol   Dehydration Secondary to GI losses from nausea and vomiting At baseline patient has a serum creatinine of 1.9 and today on admission it is 2.5 Judicious IV fluid hydration Repeat renal parameters in a.m.   History of paroxysmal atrial fibrillation Patient is rate controlled on amiodarone and metoprolol Hold Eliquis for any planned procedure   Thrombocytopenia Chronic Stable We will monitor closely during this hospitalization    DVT prophylaxis: SCD Code Status: Full code Family Communication: Greater than 50% of time was spent discussing plan of care with patient at the bedside.  All questions and concerns have been addressed.  He verbalizes understanding and agrees with the  plan. Disposition Plan: Back to previous home environment Consults called: Surgery    Willys Salvino MD [facility]     11/05/2019, 9:15 AM

## 2019-11-05 NOTE — ED Provider Notes (Signed)
[facility] Emergency Department Provider Note  ____________________________________________   First MD Initiated Contact with Patient 11/05/19 [phone]     (approximate)  I have reviewed the triage vital signs and the nursing notes.   HISTORY  Chief Complaint Emesis    HPI Andrew Watts is a 85 y.o. male with below list of previous medical conditions including chronic kidney disease diabetes mellitus hypertension hypercholesteremia myocardial infarction and CHF presents to the emergency department referred from urgent care with a history of right upper quadrant abdominal pain which patient states started last Saturday and 2-day history of nausea and vomiting. patient states that current pain score is 6 out of 10.  Patient also admits to subjective fevers at home.        Past Medical History:  Diagnosis Date  . CHF (congestive heart failure) (HCC)   . Chronic kidney disease    told 7-8 yrs ago. kidney function only 35%  . Diabetes mellitus without complication (HCC)   . Hypercholesteremia   . Hypertension   . Myocardial infarction (HCC) 1986   x2  . Wears dentures    full upper and lower    Patient Active Problem List   Diagnosis Date Noted  . Type 2 diabetes mellitus with hyperlipidemia (HCC)   . Acute systolic CHF (congestive heart failure) (HCC)   . Atrial fibrillation with RVR (HCC)   . Chronic bilateral pleural effusions   . Chronic renal failure, stage 3b   . Benign prostatic hyperplasia   . Hyperlipidemia   . Acute CHF (congestive heart failure) (HCC) 05/14/2019    Past Surgical History:  Procedure Laterality Date  . CARDIAC CATHETERIZATION  1986   stent placed  . CATARACT EXTRACTION W/PHACO Right 02/23/2016   Procedure: CATARACT EXTRACTION PHACO AND INTRAOCULAR LENS PLACEMENT (IOC);  Surgeon: Nevada Crane, MD;  Location: [facility];  Service: Ophthalmology;  Laterality: Right;  RIGHT DIABETIC - oral meds  . CATARACT  EXTRACTION W/PHACO Left 04/05/2016   Procedure: CATARACT EXTRACTION PHACO AND INTRAOCULAR LENS PLACEMENT (IOC);  Surgeon: Nevada Crane, MD;  Location: [facility];  Service: Ophthalmology;  Laterality: Left;  LEFT DIABETES - oral meds IVA TOPICAL  . [facility]E [facility]     [facility]    Prior to Admission medications   Medication Sig Start Date End Date Taking? Authorizing Provider  amiodarone (PACERONE) 200 MG tablet One tab twice a day for 7 days then one tab po daily afterwards 05/17/19   Alford Highland, MD  apixaban (ELIQUIS) 2.5 MG TABS tablet Take 1 tablet (2.5 mg total) by mouth 2 (two) times daily. 05/17/19   Alford Highland, MD  finasteride (PROSCAR) 5 MG tablet Take 5 mg by mouth daily.    [provider]  furosemide (LASIX) 40 MG tablet Take 1 tablet (40 mg total) by mouth daily. 05/23/19   Delma Freeze, FNP  linagliptin (TRADJENTA) 5 MG TABS tablet Take 1 tablet (5 mg total) by mouth daily. 05/17/19   Alford Highland, MD  metoprolol tartrate (LOPRESSOR) 100 MG tablet Take 1 tablet (100 mg total) by mouth 2 (two) times daily. 05/17/19   Alford Highland, MD  Nutritional Supplements (FEEDING SUPPLEMENT, NEPRO CARB STEADY,) LIQD Take 237 mLs by mouth 2 (two) times daily between meals. 05/17/19   Alford Highland, MD  simvastatin (ZOCOR) 20 MG tablet Take 20 mg by mouth daily.    [provider]  terazosin (HYTRIN) 2 MG capsule Take 2 mg by mouth daily.  [provider]    Allergies Patient has no known allergies.  No family history on file.  Social History Social History   Tobacco Use  . Smoking status: Former Smoker    Quit date: 03/28/1985    Years since quitting: 34.6  . Smokeless tobacco: Never Used  Vaping Use  . Vaping Use: Never used  Substance Use Topics  . Alcohol use: No  . Drug use: Not Currently    Review of Systems Constitutional: No fever/chills Eyes: No visual changes. ENT: No sore throat. Cardiovascular: Denies chest  pain. Respiratory: Denies shortness of breath. Gastrointestinal: Positive for abdominal pain nausea and vomiting Genitourinary: Negative for dysuria. Musculoskeletal: Negative for neck pain.  Negative for back pain. Integumentary: Negative for rash. Neurological: Negative for headaches, focal weakness or numbness.  ____________________________________________   PHYSICAL EXAM:  VITAL SIGNS: ED Triage Vitals [11/04/19 2316]  Enc Vitals Group     BP (!) 113/50     Pulse Rate 68     Resp 16     Temp 99.3 F (37.4 C)     Temp Source Oral     SpO2 97 %     Weight 70.3 kg (155 lb)     Height 1.803 m (5\' 11" )     Head Circumference      Peak Flow      Pain Score 6     Pain Loc      Pain Edu?      Excl. in GC?     Constitutional: Alert and oriented.  Eyes: Conjunctivae are normal.  Head: Atraumatic. Mouth/Throat: Patient is wearing a mask. Neck: No stridor.  No meningeal signs.   Cardiovascular: Normal rate, regular rhythm. Good peripheral circulation. Grossly normal heart sounds. Respiratory: Normal respiratory effort.  No retractions. Gastrointestinal: Right upper quadrant tenderness to palpation.. No distention.  Musculoskeletal: No lower extremity tenderness nor edema. No gross deformities of extremities. Neurologic:  Normal speech and language. No gross focal neurologic deficits are appreciated.  Skin:  Skin is warm, dry and intact. Psychiatric: Mood and affect are normal. Speech and behavior are normal.  ____________________________________________   LABS (all labs ordered are listed, but only abnormal results are displayed)  Labs Reviewed  PROTIME-INR - Abnormal; Notable for the following components:      Result Value   Prothrombin Time 19.1 (*)    INR 1.7 (*)    All other components within normal limits  TROPONIN I (HIGH SENSITIVITY) - Abnormal; Notable for the following components:   Troponin I (High Sensitivity) 52 (*)    All other components within normal  limits  LIPASE, BLOOD   ____________________________________________  EKG  ED ECG REPORT I, Parole N Calel Pisarski, the attending physician, personally viewed and interpreted this ECG.   Date: 11/05/2019  EKG Time: 1:43 AM  Rate: 60  Rhythm: Sinus rhythm with premature atrial contraction  Axis: Normal  Intervals: Irregular RR interval  ST&T Change: None  ____________________________________________  RADIOLOGY I, [place] N Junior Huezo, personally viewed and evaluated these images (plain radiographs) as part of my medical decision making, as well as reviewing the written report by the radiologist.  ED MD interpretation: Findings consistent with acute cholecystitis and gallstones  Official radiology report(s): 01/05/2020 ABDOMEN LIMITED RUQ  Result Date: 11/05/2019 CLINICAL DATA:  Transaminitis EXAM: ULTRASOUND ABDOMEN LIMITED RIGHT UPPER QUADRANT COMPARISON:  None. FINDINGS: Gallbladder: Thickened gallbladder wall is seen measuring up to 8.5 mm. Layering small stones or sludge is seen. The largest calculi measures 6  mm. No sonographic Eulah Pont sign is seen. Possible trace pericholecystic fluid. Common bile duct: Diameter: 5 mm Liver: No focal lesion identified. Within normal limits in parenchymal echogenicity. Portal vein is patent on color Doppler imaging with normal direction of blood flow towards the liver. Other: None. IMPRESSION: Findings which could be suggestive of acute cholecystitis Electronically Signed   By: Jonna Clark M.D.   On: 11/05/2019 00:41     Procedures   ____________________________________________   INITIAL IMPRESSION / MDM / ASSESSMENT AND PLAN / ED COURSE  As part of my medical decision making, I reviewed the following data within the electronic MEDICAL RECORD NUMBER   85 year old male presented with above-stated history and physical exam differential diagnosis including but not limited to cholelithiasis cholecystitis, ascending cholangitis.  Laboratory data revealed AST 1392  ALT 657 and a total bilirubin of 4.4.  Lipase normal.  Ultrasound consistent with cholelithiasis with cholecystitis normal common bile duct.  Patient given Zosyn 3.375 mg.  Patient subsequently discussed with Dr. Maia Plan general surgery who recommended hospitalist admission with GI consultation.  Patient subsequently discussed with Dr. Allegra Lai gastroenterologist who requested MRCP to be performed.  Patient will be admitted to the hospital staff for further evaluation and management  ____________________________________________  FINAL CLINICAL IMPRESSION(S) / ED DIAGNOSES  Final diagnoses:  Transaminitis  Cholecystitis  Calculus of gallbladder with acute cholecystitis without obstruction     MEDICATIONS GIVEN DURING THIS VISIT:  Medications  piperacillin-tazobactam (ZOSYN) IVPB 3.375 g ( Intravenous Restarted 11/05/19 0555)     ED Discharge Orders    None      *Please note:  Daire Okimoto was evaluated in Emergency Department on 11/05/2019 for the symptoms described in the history of present illness. He was evaluated in the context of the global COVID-19 pandemic, which necessitated consideration that the patient might be at risk for infection with the SARS-CoV-2 virus that causes COVID-19. Institutional protocols and algorithms that pertain to the evaluation of patients at risk for COVID-19 are in a state of rapid change based on information released by regulatory bodies including the CDC and federal and state organizations. These policies and algorithms were followed during the patient's care in the ED.  Some ED evaluations and interventions may be delayed as a result of limited staffing during and after the pandemic.*  Note:  This document was prepared using Dragon voice recognition software and may include unintentional dictation errors.   Darci Current, MD 11/05/19 [phone]

## 2019-11-05 NOTE — Progress Notes (Signed)
Pharmacy Antibiotic Note  Andrew Watts is a 85 y.o. male admitted on 11/05/2019 with cholecystitis.  Pharmacy has been consulted for Zosyn dosing.  Plan: Zosyn 3.375g IV q8h (4 hour infusion). Will follow renal function closely, if decreases further will need to reduce to q12h.  Height: 5\' 11"  (180.3 cm) Weight: 70.3 kg (155 lb) IBW/kg (Calculated) : 75.3  Temp (24hrs), Avg:98.7 F (37.1 C), Min:98.1 F (36.7 C), Max:99.3 F (37.4 C)  Recent Labs  Lab 11/04/19 2051  WBC 9.9  CREATININE 2.57*    Estimated Creatinine Clearance: 21.7 mL/min (A) (by C-G formula based on SCr of 2.57 mg/dL (H)).    Allergies  Allergen Reactions  . Pioglitazone Shortness Of Breath    Antimicrobials this admission: Zosyn 8/10 >>    Dose adjustments this admission:   Microbiology results:  Thank you for allowing pharmacy to be a part of this patient's care.  2052, PharmD, BCPS 11/05/2019 2:32 PM

## 2019-11-06 ENCOUNTER — Encounter: Payer: Self-pay | Admitting: Internal Medicine

## 2019-11-06 DIAGNOSIS — R17 Unspecified jaundice: Secondary | ICD-10-CM

## 2019-11-06 LAB — CBC
HCT: 34.7 % — ABNORMAL LOW (ref 39.0–52.0)
Hemoglobin: 12.4 g/dL — ABNORMAL LOW (ref 13.0–17.0)
MCH: 31.9 pg (ref 26.0–34.0)
MCHC: 35.7 g/dL (ref 30.0–36.0)
MCV: 89.2 fL (ref 80.0–100.0)
Platelets: 109 10*3/uL — ABNORMAL LOW (ref 150–400)
RBC: 3.89 MIL/uL — ABNORMAL LOW (ref 4.22–5.81)
RDW: 12.9 % (ref 11.5–15.5)
WBC: 6.9 10*3/uL (ref 4.0–10.5)
nRBC: 0 % (ref 0.0–0.2)

## 2019-11-06 LAB — BASIC METABOLIC PANEL
Anion gap: 12 (ref 5–15)
BUN: 47 mg/dL — ABNORMAL HIGH (ref 8–23)
CO2: 23 mmol/L (ref 22–32)
Calcium: 8.2 mg/dL — ABNORMAL LOW (ref 8.9–10.3)
Chloride: 103 mmol/L (ref 98–111)
Creatinine, Ser: 2.21 mg/dL — ABNORMAL HIGH (ref 0.61–1.24)
GFR calc Af Amer: 31 mL/min — ABNORMAL LOW (ref >60)
GFR calc non Af Amer: 27 mL/min — ABNORMAL LOW (ref >60)
Glucose, Bld: 166 mg/dL — ABNORMAL HIGH (ref 70–99)
Potassium: 2.9 mmol/L — ABNORMAL LOW (ref 3.5–5.1)
Sodium: 138 mmol/L (ref 135–145)

## 2019-11-06 LAB — HEPATIC FUNCTION PANEL
ALT: 624 U/L — ABNORMAL HIGH (ref 0–44)
AST: 312 U/L — ABNORMAL HIGH (ref 15–41)
Albumin: 3 g/dL — ABNORMAL LOW (ref 3.5–5.0)
Alkaline Phosphatase: 194 U/L — ABNORMAL HIGH (ref 38–126)
Bilirubin, Direct: 2.8 mg/dL — ABNORMAL HIGH (ref 0.0–0.2)
Indirect Bilirubin: 1.9 mg/dL — ABNORMAL HIGH (ref 0.3–0.9)
Total Bilirubin: 4.7 mg/dL — ABNORMAL HIGH (ref 0.3–1.2)
Total Protein: 6.5 g/dL (ref 6.5–8.1)

## 2019-11-06 LAB — GLUCOSE, CAPILLARY
Glucose-Capillary: 163 mg/dL — ABNORMAL HIGH (ref 70–99)
Glucose-Capillary: 165 mg/dL — ABNORMAL HIGH (ref 70–99)
Glucose-Capillary: 174 mg/dL — ABNORMAL HIGH (ref 70–99)
Glucose-Capillary: 180 mg/dL — ABNORMAL HIGH (ref 70–99)
Glucose-Capillary: 202 mg/dL — ABNORMAL HIGH (ref 70–99)
Glucose-Capillary: 203 mg/dL — ABNORMAL HIGH (ref 70–99)
Glucose-Capillary: 255 mg/dL — ABNORMAL HIGH (ref 70–99)

## 2019-11-06 LAB — HEPATITIS PANEL, ACUTE
HCV Ab: NONREACTIVE
Hep A IgM: NONREACTIVE
Hep B C IgM: NONREACTIVE
Hepatitis B Surface Ag: NONREACTIVE

## 2019-11-06 LAB — HEMOGLOBIN A1C
Hgb A1c MFr Bld: 7.7 % — ABNORMAL HIGH (ref 4.8–5.6)
Mean Plasma Glucose: 174.29 mg/dL

## 2019-11-06 MED ORDER — SODIUM CHLORIDE 0.45 % IV SOLN: INTRAVENOUS | Status: DC

## 2019-11-06 MED ORDER — METOPROLOL TARTRATE 50 MG PO TABS
50.0000 mg | ORAL_TABLET | Freq: Two times a day (BID) | ORAL | Status: DC
  Administered 2019-11-06 – 2019-11-07 (×3): 50 mg via ORAL
  Filled 2019-11-06 (×2): qty 1

## 2019-11-06 MED ORDER — POTASSIUM CHLORIDE CRYS ER 20 MEQ PO TBCR
40.0000 meq | EXTENDED_RELEASE_TABLET | Freq: Once | ORAL | Status: AC
  Administered 2019-11-06: 40 meq via ORAL
  Filled 2019-11-06: qty 2

## 2019-11-06 MED ORDER — GLUCERNA SHAKE PO LIQD
237.0000 mL | Freq: Three times a day (TID) | ORAL | Status: DC
  Administered 2019-11-06 – 2019-11-09 (×8): 237 mL via ORAL

## 2019-11-06 NOTE — Progress Notes (Signed)
Andrew Repress, MD [address]  [address]  [place], [place] [address]  Main: [phone]  Fax: [phone] Pager: [phone]   Subjective: Denies any complaints including abdominal pain, nausea or vomiting.  His wife is bedside.  He is asking for some water.   Objective: Vital signs in last 24 hours: Vitals:   11/05/19 2158 11/06/19 0130 11/06/19 0423 11/06/19 1146  BP:  135/64 (!) 128/54 139/70  Pulse:  66 (!) 53 (!) 59  Resp: (!) 22 16 16 14   Temp:  98.5 F (36.9 C) 97.9 F (36.6 C) 98 F (36.7 C)  TempSrc:  Oral Oral Oral  SpO2:  95% 91% 94%  Weight:      Height:       Weight change: -0.953 kg  Intake/Output Summary (Last 24 hours) at 11/06/2019 1348 Last data filed at 11/06/2019 01/06/2020 Gross per 24 hour  Intake 518.52 ml  Output 900 ml  Net -381.48 ml     Exam: Heart:: Regular rate and rhythm, S1S2 present or without murmur or extra heart sounds Lungs: normal and clear to auscultation Abdomen: soft, nontender, normal bowel sounds   Lab Results: CBC Latest Ref Rng & Units 11/06/2019 11/04/2019 05/15/2019  WBC 4.0 - 10.5 K/uL 6.9 9.9 8.0  Hemoglobin 13.0 - 17.0 g/dL 12.4(L) 14.4 11.0(L)  Hematocrit 39 - 52 % 34.7(L) 43.1 33.3(L)  Platelets 150 - 400 K/uL 109(L) 119(L) 127(L)   CMP Latest Ref Rng & Units 11/06/2019 11/04/2019 05/17/2019  Glucose 70 - 99 mg/dL 05/19/2019) 062(B) 762(G)  BUN 8 - 23 mg/dL 315(V) 76(H) 60(V)  Creatinine 0.61 - 1.24 mg/dL 37(T) 0.62(I) 9.48(N)  Sodium 135 - 145 mmol/L 138 131(L) 137  Potassium 3.5 - 5.1 mmol/L 2.9(L) 3.7 3.7  Chloride 98 - 111 mmol/L 103 92(L) 101  CO2 22 - 32 mmol/L 23 22 25   Calcium 8.9 - 10.3 mg/dL 8.2(L) 8.9 9.2  Total Protein 6.5 - 8.1 g/dL 6.5 8.0 -  Total Bilirubin 0.3 - 1.2 mg/dL 4.7(H) 4.4(H) -  Alkaline Phos 38 - 126 U/L 194(H) 197(H) -  AST 15 - 41 U/L 312(H) 1,392(H) -  ALT 0 - 44 U/L 624(H) 657(H) -    Micro Results: Recent Results (from the past 240 hour(s))  SARS Coronavirus 2 by RT PCR  (hospital order, performed in [facility] hospital lab) Nasopharyngeal Nasopharyngeal Swab     Status: None   Collection Time: 11/05/19  5:26 PM   Specimen: Nasopharyngeal Swab  Result Value Ref Range Status   SARS Coronavirus 2 NEGATIVE NEGATIVE Final    Comment: (NOTE) SARS-CoV-2 target nucleic acids are NOT DETECTED.  The SARS-CoV-2 RNA is generally detectable in upper and lower respiratory specimens during the acute phase of infection. The lowest concentration of SARS-CoV-2 viral copies this assay can detect is 250 copies / mL. A negative result does not preclude SARS-CoV-2 infection and should not be used as the sole basis for treatment or other patient management decisions.  A negative result may occur with improper specimen collection / handling, submission of specimen other than nasopharyngeal swab, presence of viral mutation(s) within the areas targeted by this assay, and inadequate number of viral copies (<250 copies / mL). A negative result must be combined with clinical observations, patient history, and epidemiological information.  Fact Sheet for Patients:   [facility]  Fact Sheet for Healthcare Providers: 01/05/20  This test is not yet approved or  cleared by the [employer] FDA and has been  authorized for detection and/or diagnosis of SARS-CoV-2 by FDA under an Emergency Use Authorization (EUA).  This EUA will remain in effect (meaning this test can be used) for the duration of the COVID-19 declaration under Section 564(b)(1) of the Act, 21 U.S.C. section 360bbb-3(b)(1), unless the authorization is terminated or revoked sooner.  Performed at [facility], [address], [place], [place] [address]    Studies/Results: MR ABDOMEN MRCP W WO CONTAST  Result Date: 11/05/2019 CLINICAL DATA:  Abdominal pain with suspected biliary obstruction. EXAM: MRI ABDOMEN WITHOUT AND WITH CONTRAST  (INCLUDING MRCP) TECHNIQUE: Multiplanar multisequence MR imaging of the abdomen was performed both before and after the administration of intravenous contrast. Heavily T2-weighted images of the biliary and pancreatic ducts were obtained, and three-dimensional MRCP images were rendered by post processing. CONTRAST:  58mL GADAVIST GADOBUTROL 1 MMOL/ML IV SOLN COMPARISON:  Ultrasound exam 11/05/2019. FINDINGS: Lower chest: Dependent atelectasis noted in the lower lobes. Hepatobiliary: No suspicious focal abnormality within the liver parenchyma. Gallbladder is distended with irregular diffuse gallbladder wall thickening and pericholecystic edema. Gallstones seen on ultrasound are not readily evident by MRI. No intra or extrahepatic biliary duct dilatation. No choledocholithiasis. Pancreas: 8 mm simple cystic lesion is identified in the pancreas near the junction of the body and tail. There is a second tiny 4 mm cyst in the body of pancreas. No dilatation of the main duct. Spleen:  No splenomegaly. No focal mass lesion. Adrenals/Urinary Tract: No adrenal nodule or mass. Kidneys unremarkable. Stomach/Bowel: Stomach is unremarkable. No gastric wall thickening. No evidence of outlet obstruction. Duodenum is normally positioned as is the ligament of Treitz. No small bowel or colonic dilatation within the visualized abdomen. Vascular/Lymphatic: No abdominal aortic aneurysm There is no gastrohepatic or hepatoduodenal ligament lymphadenopathy. No retroperitoneal or mesenteric lymphadenopathy. Other:  No intraperitoneal free fluid. Musculoskeletal: No focal suspicious marrow enhancement within the visualized bony anatomy. IMPRESSION: 1. Gallbladder distension with diffuse, irregular gallbladder wall thickening and pericholecystic edema. Although no gallstones are evident by MRI, several were identified on recent ultrasound earlier today. 2. No intra or extrahepatic biliary duct dilatation. No evidence of choledocholithiasis. Of  note, if the gallstones seen on ultrasound are in apparent by MRI, ductal stones may also be occult. 3. 2 tiny cystic lesions in the pancreas measuring 4 and 8 mm. These are likely benign. Consensus guidelines suggest that for cystic lesions of this size in a patient of this age, repeat imaging in 2 years is warranted. This recommendation follows ACR consensus guidelines: Management of Incidental Pancreatic Cysts: A White Paper of the ACR Incidental Findings Committee. J Am Coll Radiol 2017;14:911-923. Electronically Signed   By: Kennith Center M.D.   On: 11/05/2019 12:46   US ABDOMEN LIMITED RUQ  Result Date: 11/05/2019 CLINICAL DATA:  Transaminitis EXAM: ULTRASOUND ABDOMEN LIMITED RIGHT UPPER QUADRANT COMPARISON:  None. FINDINGS: Gallbladder: Thickened gallbladder wall is seen measuring up to 8.5 mm. Layering small stones or sludge is seen. The largest calculi measures 6 mm. No sonographic Eulah Pont sign is seen. Possible trace pericholecystic fluid. Common bile duct: Diameter: 5 mm Liver: No focal lesion identified. Within normal limits in parenchymal echogenicity. Portal vein is patent on color Doppler imaging with normal direction of blood flow towards the liver. Other: None. IMPRESSION: Findings which could be suggestive of acute cholecystitis Electronically Signed   By: Jonna Clark M.D.   On: 11/05/2019 00:41   Medications:  I have reviewed the patient's current medications. Prior to Admission:  Medications Prior to  Admission  Medication Sig Dispense Refill Last Dose  . amiodarone (PACERONE) 200 MG tablet Take 200 mg by mouth daily.   12-24 hours at Unknown  . amLODipine (NORVASC) 10 MG tablet Take 10 mg by mouth daily.   12-24 hours at Unknown  . apixaban (ELIQUIS) 2.5 MG TABS tablet Take 1 tablet (2.5 mg total) by mouth 2 (two) times daily. 60 tablet 0 12-24 hours at Unknown  . empagliflozin (JARDIANCE) 25 MG TABS tablet Take 12.5 mg by mouth daily.   12-24 hours at Unknown  . ferrous sulfate  325 (65 FE) MG tablet Take 325 mg by mouth every other day.     . finasteride (PROSCAR) 5 MG tablet Take 5 mg by mouth daily.   12-24 hours at Unknown  . furosemide (LASIX) 40 MG tablet Take 1 tablet (40 mg total) by mouth daily. 30 tablet 5 12-24 hours at Unknown  . lisinopril (ZESTRIL) 10 MG tablet Take 5 mg by mouth daily.   12-24 hours at Unknown  . metoprolol tartrate (LOPRESSOR) 100 MG tablet Take 1 tablet (100 mg total) by mouth 2 (two) times daily. 60 tablet 0 12-24 hours at Unknown  . Nutritional Supplements (FEEDING SUPPLEMENT, NEPRO CARB STEADY,) LIQD Take 237 mLs by mouth 2 (two) times daily between meals. 14220 mL 0   . simvastatin (ZOCOR) 20 MG tablet Take 20 mg by mouth daily.   12-24 hours at Unknown  . terazosin (HYTRIN) 2 MG capsule Take 2 mg by mouth daily.   12-24 hours at Unknown   Scheduled: . amiodarone  200 mg Oral Daily  . feeding supplement (GLUCERNA SHAKE)  237 mL Oral TID BM  . finasteride  5 mg Oral Daily  . insulin aspart  0-9 Units Subcutaneous Q4H  . metoprolol tartrate  50 mg Oral BID  . pantoprazole (PROTONIX) IV  40 mg Intravenous Q24H  . potassium chloride  40 mEq Oral Once  . terazosin  2 mg Oral Daily   Continuous: . dextrose 5 % and 0.9% NaCl 75 mL/hr at 11/05/19 2225  . piperacillin-tazobactam (ZOSYN)  IV 3.375 g (11/06/19 0511)   OFB:PZWCHENI injection, ondansetron **OR** ondansetron (ZOFRAN) IV Anti-infectives (From admission, onward)   Start     Dose/Rate Route Frequency Ordered Stop   11/05/19 1400  piperacillin-tazobactam (ZOSYN) IVPB 3.375 g     Discontinue     3.375 g 12.5 mL/hr over 240 Minutes Intravenous Every 8 hours 11/05/19 1018     11/05/19 0545  piperacillin-tazobactam (ZOSYN) IVPB 3.375 g        3.375 g 100 mL/hr over 30 Minutes Intravenous  Once 11/05/19 0541 11/05/19 1001     Scheduled Meds: . amiodarone  200 mg Oral Daily  . feeding supplement (GLUCERNA SHAKE)  237 mL Oral TID BM  . finasteride  5 mg Oral Daily  .  insulin aspart  0-9 Units Subcutaneous Q4H  . metoprolol tartrate  50 mg Oral BID  . pantoprazole (PROTONIX) IV  40 mg Intravenous Q24H  . potassium chloride  40 mEq Oral Once  . terazosin  2 mg Oral Daily   Continuous Infusions: . dextrose 5 % and 0.9% NaCl 75 mL/hr at 11/05/19 2225  . piperacillin-tazobactam (ZOSYN)  IV 3.375 g (11/06/19 0511)   PRN Meds:.morphine injection, ondansetron **OR** ondansetron (ZOFRAN) IV   Assessment: Principal Problem:   Acute cholecystitis Active Problems:   Type 2 diabetes mellitus with stage 3 chronic kidney disease (HCC)   AF (paroxysmal atrial fibrillation) (  HCC)   Chronic systolic CHF (congestive heart failure) (HCC)   Dehydration, mild   Thrombocytopenia (HCC)   Plan: Elevated LFTs secondary to acute cholecystitis MRCP did not reveal choledocholithiasis Acute viral hepatitis panel negative LFTs are improving but bilirubin is still elevated Monitor LFTs daily, if bilirubin is persistently elevated and not improving, will discuss with Dr. Servando SnareWohl about possibility of ERCP.  If not, Intra-Op cholangiogram can be considered when patient undergoes cholecystectomy Patient has been off Eliquis since 8/10 Continue clear liquid diet with protein supplements Surgery is following   LOS: 1 day   Peighton Mehra 11/06/2019, 1:48 PM

## 2019-11-06 NOTE — Progress Notes (Signed)
PROGRESS NOTE  Andrew Watts URK:[id number] DOB: July 07, 1934 DOA: 11/05/2019 PCP: Noel Gerold, PA  Brief History   Andrew Watts is a 85 y.o. male with medical history significant for diabetes mellitus, coronary artery disease, CHF who was referred to the emergency room from the urgent care center for evaluation of nausea, vomiting and inability to keep any food down associated with pain in the right upper quadrant.  Patient has had symptoms for 2 days and sought medical help due to the refractory nature of his symptoms.  He rated his right upper quadrant pain a 6 x 10 in intensity at its worst.  He denied having any fever or chills.  He denied having any changes in his bowel habits, no chest pain, no shortness of breath, no palpitations or diaphoresis.  He denied having any cough or urinary symptoms. General evaluation patient appeared comfortable and stated that his pain had improved. Labs reveal a sodium of 131, glucose of 246, BUN of 59, creatinine of 2.57, AST 1392, ALT 657, white count of 9.9 with a left shift and hemoglobin of 14. Abdominal ultrasound showed findings suggestive of acute cholecystitis Twelve-lead EKG reviewed by me showed sinus rhythm with PVCs  ED Course: Patient is an 85 year old Caucasian male who was sent to the emergency room from urgent care for evaluation of right upper quadrant pain associated with nausea, vomiting and inability to tolerate oral intake.  Patient noted to have marked transaminitis and ultrasound suggestive of acute cholecystitis.  He will be admitted to the hospital for further evaluation.  The patient was admitted to a med surg bed. MRCP was performed and was negative for obstructive pathology. The patient has been evaluated by general surgery and he is being considered for possible laparoscopic cholecystectomy in a day or two vs a non-surgical approach to his illness.  He is receiving IV Zosyn.  Consultants  . Gastroenterology . General  Surgery  Procedures  . MRCP  Antibiotics   Anti-infectives (From admission, onward)   Start     Dose/Rate Route Frequency Ordered Stop   11/05/19 1400  piperacillin-tazobactam (ZOSYN) IVPB 3.375 g     Discontinue     3.375 g 12.5 mL/hr over 240 Minutes Intravenous Every 8 hours 11/05/19 1018     11/05/19 0545  piperacillin-tazobactam (ZOSYN) IVPB 3.375 g        3.375 g 100 mL/hr over 30 Minutes Intravenous  Once 11/05/19 0541 11/05/19 1001    .   Subjective  The patient is resting comfortably. He has no new complaints.  Objective   Vitals:  Vitals:   11/06/19 0423 11/06/19 1146  BP: (!) 128/54 139/70  Pulse: (!) 53 (!) 59  Resp: 16 14  Temp: 97.9 F (36.6 C) 98 F (36.7 C)  SpO2: 91% 94%    Exam:  Constitutional:  . The patient is awake, alert, and oriented x 3. No acute distress. Respiratory:  . No increased work of breathing. . No wheezes, rales, or rhonchi . No tactile fremitus Cardiovascular:  . Regular rate and rhythm . No murmurs, ectopy, or gallups. . No lateral PMI. No thrills. Abdomen:  . Abdomen is soft, non-tender, non-distended . No hernias, masses, or organomegaly . Normoactive bowel sounds.  Musculoskeletal:  . No cyanosis, clubbing, or edema Skin:  . No rashes, lesions, ulcers . palpation of skin: no induration or nodules Neurologic:  . CN 2-12 intact . Sensation all 4 extremities intact Psychiatric:  . Mental status o Mood, affect appropriate o Orientation  to person, place, time  . judgment and insight appear intact  I have personally reviewed the following:   Today's Data  . Vitals, BMP, CBC  Micro Data  . Blood cultures x 2 pending.  Imaging  . MRCP  Scheduled Meds: . amiodarone  200 mg Oral Daily  . feeding supplement (GLUCERNA SHAKE)  237 mL Oral TID BM  . finasteride  5 mg Oral Daily  . insulin aspart  0-9 Units Subcutaneous Q4H  . metoprolol tartrate  50 mg Oral BID  . pantoprazole (PROTONIX) IV  40 mg Intravenous  Q24H  . potassium chloride  40 mEq Oral Once  . terazosin  2 mg Oral Daily   Continuous Infusions: . dextrose 5 % and 0.9% NaCl 75 mL/hr at 11/05/19 2225  . piperacillin-tazobactam (ZOSYN)  IV 3.375 g (11/06/19 1441)    Principal Problem:   Acute cholecystitis Active Problems:   Type 2 diabetes mellitus with stage 3 chronic kidney disease (HCC)   AF (paroxysmal atrial fibrillation) (HCC)   Chronic systolic CHF (congestive heart failure) (HCC)   Dehydration, mild   Thrombocytopenia (HCC)   LOS: 1 day   A & P   Acute cholecystitis: Place patient on IV antibiotic therapy with Zosyn adjusted to renal function Supportive care with IV antiemetics, pain control and IV PPI. Follow-up results of blood cultures. MRCP was negative for obstructive pathology. General surgery has evaluated the patient and is considering cholecystectomy vs non-operative approach.  Diabetes mellitus with complications of stage III chronic kidney disease: Monitor creatinine, elelctrolytes, and volume status. The patient is on a clear liquid diet. Glucoses are being followed with FSBS and SSI. He is currently tolerating a clear liquid diet.  Chronic systolic CHF: Patient's last 2D echocardiogram from 2021 showed an LVEF of 35 to 40%. Pt currently appears a little dry with elevation of his creatinine above baseline of 1.94 to 2.21. Hold diuretics for now we will monitor patient carefully for signs of volume overload.  Dehydration: BUN/Creatinine ration is 21.3. This is likely due to GI losses from nausea and vomiting. Continue 1/2 NS. DC glucose from fluids as the patient is tolerating clears.   History of paroxysmal atrial fibrillation: Patient is rate controlled on amiodarone and metoprolol at home. However this morning the patient's HR was low. For this reason amiodarone was held and metoprolol dose was reduced to 50 mg bid. Continue to monitor heart rate and blood pressure carefully. Eliquis has been held  pending any planned procedure.  Thrombocytopenia: Chronic. Platelets down to 109 today. Monitor. No anticoagulation.  I have seen and examined this patient myself. I have spent 35 minutes in his evaluation and care.  DVT prophylaxis: SCD Code Status: Full code Family Communication: Greater than 50% of time was spent discussing plan of care with patient at the bedside.  All questions and concerns have been addressed.  He verbalizes understanding and agrees with the plan. Disposition Plan: Back to previous home environment Consults called: Surgery  Debarah Mccumbers, DO [facility] Direct contact: see [email or web address]  7PM-7AM contact night coverage as above 11/06/2019, 4:06 PM  LOS: 1 day

## 2019-11-06 NOTE — Progress Notes (Signed)
SURGICAL PROGRESS NOTE   Hospital Day(s): 1.   Post op day(s):  Marland Kitchen   Interval History: Patient seen and examined, no acute events or new complaints overnight. Patient reports having no abdominal pain.  Patient tolerating clear liquids.  No pain with clear liquids.  There is no pain radiation.  There is no alleviating or aggravating factor.  Vital signs in last 24 hours: [min-max] current  Temp:  [97.9 F (36.6 C)-98.5 F (36.9 C)] 97.9 F (36.6 C) (08/11 0423) Pulse Rate:  [53-70] 53 (08/11 0423) Resp:  [13-26] 16 (08/11 0423) BP: (119-147)/(54-74) 128/54 (08/11 0423) SpO2:  [91 %-100 %] 91 % (08/11 0423) Weight:  [69.4 kg] 69.4 kg (08/10 2127)     Height: 5\' 11"  (180.3 cm) Weight: 69.4 kg BMI (Calculated): 21.33   Physical Exam:  Constitutional: alert, cooperative and no distress  Respiratory: breathing non-labored at rest  Cardiovascular: regular rate and sinus rhythm  Gastrointestinal: soft, non-tender, and non-distended  Labs:  CBC Latest Ref Rng & Units 11/06/2019 11/04/2019 05/15/2019  WBC 4.0 - 10.5 K/uL 6.9 9.9 8.0  Hemoglobin 13.0 - 17.0 g/dL 12.4(L) 14.4 11.0(L)  Hematocrit 39 - 52 % 34.7(L) 43.1 33.3(L)  Platelets 150 - 400 K/uL 109(L) 119(L) 127(L)   CMP Latest Ref Rng & Units 11/06/2019 11/04/2019 05/17/2019  Glucose 70 - 99 mg/dL 05/19/2019) 220(U) 542(H)  BUN 8 - 23 mg/dL 062(B) 76(E) 83(T)  Creatinine 0.61 - 1.24 mg/dL 51(V) 6.16(W) 7.37(T)  Sodium 135 - 145 mmol/L 138 131(L) 137  Potassium 3.5 - 5.1 mmol/L 2.9(L) 3.7 3.7  Chloride 98 - 111 mmol/L 103 92(L) 101  CO2 22 - 32 mmol/L 23 22 25   Calcium 8.9 - 10.3 mg/dL 8.2(L) 8.9 9.2  Total Protein 6.5 - 8.1 g/dL 6.5 8.0 -  Total Bilirubin 0.3 - 1.2 mg/dL 4.7(H) 4.4(H) -  Alkaline Phos 38 - 126 U/L 194(H) 197(H) -  AST 15 - 41 U/L 312(H) 1,392(H) -  ALT 0 - 44 U/L 624(H) 657(H) -    Imaging studies:  I personally evaluated the MRI and discussed with radiologist.  There is no sign of filling defect and the common  bile duct.  Interestingly no stones were appreciated on the gallbladder either which were seen on the ultrasound.  Upon discussion with radiologist he cannot completely exclude common bile duct stones or large but at this moment there is no sign of obstruction.  Assessment/Plan:  85 y.o. male with acute cholecystitis, complicated by pertinent comorbidities including possible choledocholithiasis, CHF, atrial fibrillation on Eliquis, chronic kidney disease stage IIIb.  Patient today with increased bilirubin but decreased AST.  Hopefully this is just a delay in the decrease of bilirubin.  The fact that the patient has no abdominal pain, MRCP did not show any filling defect in the common bile duct and the AST significantly decreased a good sign and there is no any urgency of any procedure at this moment.  Still patient needs to be observed for the drain of the bilirubin and alkaline phosphatase.  The bilirubin increased from 4.4 to 4.7 and alkaline phosphatase just decreased from 197 to 194.  I recommend to continue with clear liquids, continue IV antibiotic therapy and follow-up bilirubin and liver enzymes trend tomorrow.  , MD

## 2019-11-07 ENCOUNTER — Encounter: Payer: Self-pay | Admitting: Family

## 2019-11-07 DIAGNOSIS — K8000 Calculus of gallbladder with acute cholecystitis without obstruction: Principal | ICD-10-CM

## 2019-11-07 LAB — HEPATIC FUNCTION PANEL
ALT: 373 U/L — ABNORMAL HIGH (ref 0–44)
AST: 87 U/L — ABNORMAL HIGH (ref 15–41)
Albumin: 2.7 g/dL — ABNORMAL LOW (ref 3.5–5.0)
Alkaline Phosphatase: 169 U/L — ABNORMAL HIGH (ref 38–126)
Bilirubin, Direct: 1.1 mg/dL — ABNORMAL HIGH (ref 0.0–0.2)
Indirect Bilirubin: 1.4 mg/dL — ABNORMAL HIGH (ref 0.3–0.9)
Total Bilirubin: 2.5 mg/dL — ABNORMAL HIGH (ref 0.3–1.2)
Total Protein: 6.1 g/dL — ABNORMAL LOW (ref 6.5–8.1)

## 2019-11-07 LAB — BASIC METABOLIC PANEL
Anion gap: 8 (ref 5–15)
BUN: 33 mg/dL — ABNORMAL HIGH (ref 8–23)
CO2: 22 mmol/L (ref 22–32)
Calcium: 8 mg/dL — ABNORMAL LOW (ref 8.9–10.3)
Chloride: 105 mmol/L (ref 98–111)
Creatinine, Ser: 2.06 mg/dL — ABNORMAL HIGH (ref 0.61–1.24)
GFR calc Af Amer: 34 mL/min — ABNORMAL LOW (ref >60)
GFR calc non Af Amer: 29 mL/min — ABNORMAL LOW (ref >60)
Glucose, Bld: 140 mg/dL — ABNORMAL HIGH (ref 70–99)
Potassium: 3.6 mmol/L (ref 3.5–5.1)
Sodium: 135 mmol/L (ref 135–145)

## 2019-11-07 LAB — CBC
HCT: 33.9 % — ABNORMAL LOW (ref 39.0–52.0)
Hemoglobin: 11.9 g/dL — ABNORMAL LOW (ref 13.0–17.0)
MCH: 31.6 pg (ref 26.0–34.0)
MCHC: 35.1 g/dL (ref 30.0–36.0)
MCV: 90.2 fL (ref 80.0–100.0)
Platelets: 104 10*3/uL — ABNORMAL LOW (ref 150–400)
RBC: 3.76 MIL/uL — ABNORMAL LOW (ref 4.22–5.81)
RDW: 12.9 % (ref 11.5–15.5)
WBC: 5.5 10*3/uL (ref 4.0–10.5)
nRBC: 0 % (ref 0.0–0.2)

## 2019-11-07 LAB — GLUCOSE, CAPILLARY
Glucose-Capillary: 139 mg/dL — ABNORMAL HIGH (ref 70–99)
Glucose-Capillary: 120 mg/dL — ABNORMAL HIGH (ref 70–99)
Glucose-Capillary: 191 mg/dL — ABNORMAL HIGH (ref 70–99)
Glucose-Capillary: 204 mg/dL — ABNORMAL HIGH (ref 70–99)
Glucose-Capillary: 164 mg/dL — ABNORMAL HIGH (ref 70–99)

## 2019-11-07 MED ORDER — METOPROLOL TARTRATE 25 MG PO TABS
25.0000 mg | ORAL_TABLET | Freq: Two times a day (BID) | ORAL | Status: DC
  Administered 2019-11-07 – 2019-11-10 (×6): 25 mg via ORAL
  Filled 2019-11-07 (×6): qty 1

## 2019-11-07 MED ORDER — MORPHINE SULFATE (PF) 2 MG/ML IV SOLN
2.0000 mg | Freq: Once | INTRAVENOUS | Status: AC
  Administered 2019-11-07: 2 mg via INTRAVENOUS
  Filled 2019-11-07: qty 1

## 2019-11-07 NOTE — Progress Notes (Signed)
PROGRESS NOTE  Dontell Mian IWL:[id number] DOB: 12-04-1934 DOA: 11/05/2019 PCP: Noel Gerold, PA  Brief History   Kevis Qu is a 85 y.o. male with medical history significant for diabetes mellitus, coronary artery disease, CHF who was referred to the emergency room from the urgent care center for evaluation of nausea, vomiting and inability to keep any food down associated with pain in the right upper quadrant.  Patient has had symptoms for 2 days and sought medical help due to the refractory nature of his symptoms.  He rated his right upper quadrant pain a 6 x 10 in intensity at its worst.  He denied having any fever or chills.  He denied having any changes in his bowel habits, no chest pain, no shortness of breath, no palpitations or diaphoresis.  He denied having any cough or urinary symptoms. General evaluation patient appeared comfortable and stated that his pain had improved. Labs reveal a sodium of 131, glucose of 246, BUN of 59, creatinine of 2.57, AST 1392, ALT 657, white count of 9.9 with a left shift and hemoglobin of 14. Abdominal ultrasound showed findings suggestive of acute cholecystitis Twelve-lead EKG reviewed by me showed sinus rhythm with PVCs  ED Course: Patient is an 85 year old Caucasian male who was sent to the emergency room from urgent care for evaluation of right upper quadrant pain associated with nausea, vomiting and inability to tolerate oral intake.  Patient noted to have marked transaminitis and ultrasound suggestive of acute cholecystitis.  He will be admitted to the hospital for further evaluation.  The patient was admitted to a med surg bed. MRCP was performed and was negative for obstructive pathology. The patient has been evaluated by general surgery and he is being considered for possible laparoscopic cholecystectomy in a day or two vs a non-surgical approach to his illness.  He is receiving IV Zosyn.  I have discussed this patient with Dr. Hazle Quant  from General Surgery. He will be advanced to a soft diet today. If he tolerates it he may be discharged tomorrow with instructions to follow up with surgery as outpatient for resolution of his cholecystectomy at another time.  Consultants  . Gastroenterology . General Surgery  Procedures  . MRCP  Antibiotics   Anti-infectives (From admission, onward)   Start     Dose/Rate Route Frequency Ordered Stop   11/05/19 1400  piperacillin-tazobactam (ZOSYN) IVPB 3.375 g     Discontinue     3.375 g 12.5 mL/hr over 240 Minutes Intravenous Every 8 hours 11/05/19 1018     11/05/19 0545  piperacillin-tazobactam (ZOSYN) IVPB 3.375 g        3.375 g 100 mL/hr over 30 Minutes Intravenous  Once 11/05/19 0541 11/05/19 1001      Subjective  The patient is resting comfortably. He has no new complaints.  Objective   Vitals:  Vitals:   11/07/19 0418 11/07/19 1120  BP: (!) 145/65 (!) 131/96  Pulse: 61 (!) 55  Resp: 18   Temp: 98.9 F (37.2 C) 98.4 F (36.9 C)  SpO2: 96% 97%    Exam:  Constitutional:  . The patient is awake, alert, and oriented x 3. No acute distress. Respiratory:  . No increased work of breathing. . No wheezes, rales, or rhonchi . No tactile fremitus Cardiovascular:  . Regular rate and rhythm . No murmurs, ectopy, or gallups. . No lateral PMI. No thrills. Abdomen:  . Abdomen is soft, non-tender, non-distended . No hernias, masses, or organomegaly . Normoactive bowel sounds.  Musculoskeletal:  . No cyanosis, clubbing, or edema Skin:  . No rashes, lesions, ulcers . palpation of skin: no induration or nodules Neurologic:  . CN 2-12 intact . Sensation all 4 extremities intact Psychiatric:  . Mental status o Mood, affect appropriate o Orientation to person, place, time  . judgment and insight appear intact  I have personally reviewed the following:   Today's Data  . Vitals, BMP, CBC  Micro Data  . Blood cultures x 2 pending.  Imaging   . MRCP  Scheduled Meds: . amiodarone  200 mg Oral Daily  . feeding supplement (GLUCERNA SHAKE)  237 mL Oral TID BM  . finasteride  5 mg Oral Daily  . insulin aspart  0-9 Units Subcutaneous Q4H  . metoprolol tartrate  25 mg Oral BID  . pantoprazole (PROTONIX) IV  40 mg Intravenous Q24H  . terazosin  2 mg Oral Daily   Continuous Infusions: . sodium chloride 75 mL/hr at 11/07/19 1550  . piperacillin-tazobactam (ZOSYN)  IV Stopped (11/07/19 1320)    Principal Problem:   Acute cholecystitis Active Problems:   Type 2 diabetes mellitus with stage 3 chronic kidney disease (HCC)   AF (paroxysmal atrial fibrillation) (HCC)   Chronic systolic CHF (congestive heart failure) (HCC)   Dehydration, mild   Thrombocytopenia (HCC)   LOS: 2 days   A & P   Acute cholecystitis: Place patient on IV antibiotic therapy with Zosyn adjusted to renal function Supportive care with IV antiemetics, pain control and IV PPI. Follow-up results of blood cultures. MRCP was negative for obstructive pathology. General surgery has evaluated the patient and is considering cholecystectomy vs non-operative approach. I have discussed this patient with Dr. Hazle Quant from General Surgery. He will be advanced to a soft diet today. If he tolerates it he may be discharged tomorrow with instructions to follow up with surgery as outpatient for resolution of his cholecystectomy at another time.  Diabetes mellitus with complications of stage III chronic kidney disease: Monitor creatinine, elelctrolytes, and volume status. The patient is on a clear liquid diet. Glucoses are being followed with FSBS and SSI. He is currently tolerating a clear liquid diet.  Chronic systolic CHF: Patient's last 2D echocardiogram from 2021 showed an LVEF of 35 to 40%. Pt currently appears a little dry with elevation of his creatinine above baseline of 1.94 to 2.21. Hold diuretics for now we will monitor patient carefully for signs of volume  overload.  Dehydration: BUN/Creatinine ration is 21.3. This is likely due to GI losses from nausea and vomiting. Continue 1/2 NS. DC glucose from fluids as the patient is tolerating clears.   History of paroxysmal atrial fibrillation: Patient is rate controlled on amiodarone and metoprolol at home. However this morning the patient's HR was low. For this reason amiodarone was held and metoprolol dose was reduced to 50 mg bid. Continue to monitor heart rate and blood pressure carefully. Eliquis has been held pending any planned procedure.  Thrombocytopenia: Chronic. Platelets down to 109 today. Monitor. No anticoagulation.  I have seen and examined this patient myself. I have spent 35 minutes in his evaluation and care.  DVT prophylaxis: SCD Code Status: Full code Family Communication: Greater than 50% of time was spent discussing plan of care with patient at the bedside.  All questions and concerns have been addressed.  He verbalizes understanding and agrees with the plan. Disposition Plan: Back to previous home environment Consults called: Surgery  Allahna Husband, DO [facility] Direct contact: see [email or web address]  7PM-7AM contact night coverage as above 11/07/2019, 4:29 PM  LOS: 1 day

## 2019-11-07 NOTE — Progress Notes (Signed)
SURGICAL PROGRESS NOTE   Hospital Day(s): 2.   Post op day(s):  Marland Kitchen   Interval History: Patient seen and examined, no acute events or new complaints overnight. Patient reports having abdominal pain.  The separation.  There is no alleviating or aggravating factor.  Patient reports that he ate very well.  There is no nausea or vomiting.  Vital signs in last 24 hours: [min-max] current  Temp:  [98.4 F (36.9 C)-98.9 F (37.2 C)] 98.4 F (36.9 C) (08/12 1120) Pulse Rate:  [55-61] 55 (08/12 1120) Resp:  [18] 18 (08/12 0418) BP: (131-145)/(65-96) 131/96 (08/12 1120) SpO2:  [96 %-97 %] 97 % (08/12 1120)     Height: 5\' 11"  (180.3 cm) Weight: 69.4 kg BMI (Calculated): 21.33   Physical Exam:  Constitutional: alert, cooperative and no distress  Respiratory: breathing non-labored at rest  Cardiovascular: regular rate and sinus rhythm  Gastrointestinal: soft, non-tender, and non-distended  Labs:  CBC Latest Ref Rng & Units 11/07/2019 11/06/2019 11/04/2019  WBC 4.0 - 10.5 K/uL 5.5 6.9 9.9  Hemoglobin 13.0 - 17.0 g/dL 11.9(L) 12.4(L) 14.4  Hematocrit 39 - 52 % 33.9(L) 34.7(L) 43.1  Platelets 150 - 400 K/uL 104(L) 109(L) 119(L)   CMP Latest Ref Rng & Units 11/07/2019 11/06/2019 11/04/2019  Glucose 70 - 99 mg/dL 01/04/2020) 734(K) 876(O)  BUN 8 - 23 mg/dL 115(B) 26(O) 03(T)  Creatinine 0.61 - 1.24 mg/dL 59(R) 4.16(L) 8.45(X)  Sodium 135 - 145 mmol/L 135 138 131(L)  Potassium 3.5 - 5.1 mmol/L 3.6 2.9(L) 3.7  Chloride 98 - 111 mmol/L 105 103 92(L)  CO2 22 - 32 mmol/L 22 23 22   Calcium 8.9 - 10.3 mg/dL 8.0(L) 8.2(L) 8.9  Total Protein 6.5 - 8.1 g/dL 6.1(L) 6.5 8.0  Total Bilirubin 0.3 - 1.2 mg/dL 2.5(H) 4.7(H) 4.4(H)  Alkaline Phos 38 - 126 U/L 169(H) 194(H) 197(H)  AST 15 - 41 U/L 87(H) 312(H) 1,392(H)  ALT 0 - 44 U/L 373(H) 624(H) 657(H)    Imaging studies: No new pertinent imaging studies   Assessment/Plan:  85 y.o. male with acute cholecystitis, complicated by pertinent comorbidities  including possible choledocholithiasis, CHF, atrial fibrillation on Eliquis, chronic kidney disease stage IIIb.  Patient recovering adequately.  Today there was a significant decrease in the bilirubin which is consistent with passing a stone.  The fact that the patient has been without abdominal pain is because he is responding adequately to IV antibiotic therapy.  If the patient tolerates of diet today I think that it would be reasonable to discharge the patient on antibiotic therapy and I will follow closely in my office to discuss about surgical management.  Due to the onset of pains of more than 5 days I think that the risk of surgery at this moment is higher than the benefits.  This patient will benefit of a complete course of antibiotic therapy and an interval cholecystectomy.  I will continue to follow.  , MD

## 2019-11-07 NOTE — Progress Notes (Signed)
Arlyss Repress, MD [address]  [address]  [place], [place] [address]  Main: [phone]  Fax: [phone] Pager: [phone]   Subjective: Denies any complaints including abdominal pain, nausea or vomiting.  Patient is tolerating p.o. diet well   Objective: Vital signs in last 24 hours: Vitals:   11/06/19 1146 11/06/19 2037 11/07/19 0418 11/07/19 1120  BP: 139/70 138/68 (!) 145/65 (!) 131/96  Pulse: (!) 59 61 61 (!) 55  Resp: 14 18 18    Temp: 98 F (36.7 C) 98.4 F (36.9 C) 98.9 F (37.2 C) 98.4 F (36.9 C)  TempSrc: Oral Oral Oral Oral  SpO2: 94% 96% 96% 97%  Weight:      Height:       Weight change:   Intake/Output Summary (Last 24 hours) at 11/07/2019 1543 Last data filed at 11/07/2019 1531 Gross per 24 hour  Intake 2967.11 ml  Output 1000 ml  Net 1967.11 ml     Exam: Heart:: Regular rate and rhythm, S1S2 present or without murmur or extra heart sounds Lungs: normal and clear to auscultation Abdomen: soft, nontender, normal bowel sounds   Lab Results: CBC Latest Ref Rng & Units 11/07/2019 11/06/2019 11/04/2019  WBC 4.0 - 10.5 K/uL 5.5 6.9 9.9  Hemoglobin 13.0 - 17.0 g/dL 11.9(L) 12.4(L) 14.4  Hematocrit 39 - 52 % 33.9(L) 34.7(L) 43.1  Platelets 150 - 400 K/uL 104(L) 109(L) 119(L)   CMP Latest Ref Rng & Units 11/07/2019 11/06/2019 11/04/2019  Glucose 70 - 99 mg/dL 01/04/2020) 720(N) 470(J)  BUN 8 - 23 mg/dL 628(Z) 66(Q) 94(T)  Creatinine 0.61 - 1.24 mg/dL 65(Y) 6.50(P) 5.46(F)  Sodium 135 - 145 mmol/L 135 138 131(L)  Potassium 3.5 - 5.1 mmol/L 3.6 2.9(L) 3.7  Chloride 98 - 111 mmol/L 105 103 92(L)  CO2 22 - 32 mmol/L 22 23 22   Calcium 8.9 - 10.3 mg/dL 8.0(L) 8.2(L) 8.9  Total Protein 6.5 - 8.1 g/dL 6.1(L) 6.5 8.0  Total Bilirubin 0.3 - 1.2 mg/dL 2.5(H) 4.7(H) 4.4(H)  Alkaline Phos 38 - 126 U/L 169(H) 194(H) 197(H)  AST 15 - 41 U/L 87(H) 312(H) 1,392(H)  ALT 0 - 44 U/L 373(H) 624(H) 657(H)    Micro Results: Recent Results (from the past 240  hour(s))  SARS Coronavirus 2 by RT PCR (hospital order, performed in [facility] hospital lab) Nasopharyngeal Nasopharyngeal Swab     Status: None   Collection Time: 11/05/19  5:26 PM   Specimen: Nasopharyngeal Swab  Result Value Ref Range Status   SARS Coronavirus 2 NEGATIVE NEGATIVE Final    Comment: (NOTE) SARS-CoV-2 target nucleic acids are NOT DETECTED.  The SARS-CoV-2 RNA is generally detectable in upper and lower respiratory specimens during the acute phase of infection. The lowest concentration of SARS-CoV-2 viral copies this assay can detect is 250 copies / mL. A negative result does not preclude SARS-CoV-2 infection and should not be used as the sole basis for treatment or other patient management decisions.  A negative result may occur with improper specimen collection / handling, submission of specimen other than nasopharyngeal swab, presence of viral mutation(s) within the areas targeted by this assay, and inadequate number of viral copies (<250 copies / mL). A negative result must be combined with clinical observations, patient history, and epidemiological information.  Fact Sheet for Patients:   [facility]  Fact Sheet for Healthcare Providers: 01/05/20  This test is not yet approved or  cleared by the [employer] FDA and has been authorized for detection  and/or diagnosis of SARS-CoV-2 by FDA under an Emergency Use Authorization (EUA).  This EUA will remain in effect (meaning this test can be used) for the duration of the COVID-19 declaration under Section 564(b)(1) of the Act, 21 U.S.C. section 360bbb-3(b)(1), unless the authorization is terminated or revoked sooner.  Performed at [facility], [address] [place], [place] [address]    Studies/Results: No results found. Medications:  I have reviewed the patient's current medications. Prior to Admission:  Medications Prior to  Admission  Medication Sig Dispense Refill Last Dose  . amiodarone (PACERONE) 200 MG tablet Take 200 mg by mouth daily.   12-24 hours at Unknown  . amLODipine (NORVASC) 10 MG tablet Take 10 mg by mouth daily.   12-24 hours at Unknown  . apixaban (ELIQUIS) 2.5 MG TABS tablet Take 1 tablet (2.5 mg total) by mouth 2 (two) times daily. 60 tablet 0 12-24 hours at Unknown  . empagliflozin (JARDIANCE) 25 MG TABS tablet Take 12.5 mg by mouth daily.   12-24 hours at Unknown  . ferrous sulfate 325 (65 FE) MG tablet Take 325 mg by mouth every other day.     . finasteride (PROSCAR) 5 MG tablet Take 5 mg by mouth daily.   12-24 hours at Unknown  . furosemide (LASIX) 40 MG tablet Take 1 tablet (40 mg total) by mouth daily. 30 tablet 5 12-24 hours at Unknown  . lisinopril (ZESTRIL) 10 MG tablet Take 5 mg by mouth daily.   12-24 hours at Unknown  . metoprolol tartrate (LOPRESSOR) 100 MG tablet Take 1 tablet (100 mg total) by mouth 2 (two) times daily. 60 tablet 0 12-24 hours at Unknown  . Nutritional Supplements (FEEDING SUPPLEMENT, NEPRO CARB STEADY,) LIQD Take 237 mLs by mouth 2 (two) times daily between meals. 14220 mL 0   . simvastatin (ZOCOR) 20 MG tablet Take 20 mg by mouth daily.   12-24 hours at Unknown  . terazosin (HYTRIN) 2 MG capsule Take 2 mg by mouth daily.   12-24 hours at Unknown   Scheduled: . amiodarone  200 mg Oral Daily  . feeding supplement (GLUCERNA SHAKE)  237 mL Oral TID BM  . finasteride  5 mg Oral Daily  . insulin aspart  0-9 Units Subcutaneous Q4H  . metoprolol tartrate  25 mg Oral BID  . pantoprazole (PROTONIX) IV  40 mg Intravenous Q24H  . terazosin  2 mg Oral Daily   Continuous: . sodium chloride 75 mL/hr at 11/07/19 1531  . piperacillin-tazobactam (ZOSYN)  IV Stopped (11/07/19 1320)   BMW:[id number] injection, ondansetron **OR** ondansetron (ZOFRAN) IV Anti-infectives (From admission, onward)   Start     Dose/Rate Route Frequency Ordered Stop   11/05/19 1400   piperacillin-tazobactam (ZOSYN) IVPB 3.375 g     Discontinue     3.375 g 12.5 mL/hr over 240 Minutes Intravenous Every 8 hours 11/05/19 1018     11/05/19 0545  piperacillin-tazobactam (ZOSYN) IVPB 3.375 g        3.375 g 100 mL/hr over 30 Minutes Intravenous  Once 11/05/19 0541 11/05/19 1001     Scheduled Meds: . amiodarone  200 mg Oral Daily  . feeding supplement (GLUCERNA SHAKE)  237 mL Oral TID BM  . finasteride  5 mg Oral Daily  . insulin aspart  0-9 Units Subcutaneous Q4H  . metoprolol tartrate  25 mg Oral BID  . pantoprazole (PROTONIX) IV  40 mg Intravenous Q24H  . terazosin  2 mg Oral Daily   Continuous  Infusions: . sodium chloride 75 mL/hr at 11/07/19 1531  . piperacillin-tazobactam (ZOSYN)  IV Stopped (11/07/19 1320)   PRN Meds:.morphine injection, ondansetron **OR** ondansetron (ZOFRAN) IV   Assessment: Principal Problem:   Acute cholecystitis Active Problems:   Type 2 diabetes mellitus with stage 3 chronic kidney disease (HCC)   AF (paroxysmal atrial fibrillation) (HCC)   Chronic systolic CHF (congestive heart failure) (HCC)   Dehydration, mild   Thrombocytopenia (HCC)   Plan: Elevated LFTs secondary to acute cholecystitis MRCP did not reveal choledocholithiasis Acute viral hepatitis panel negative LFTs are improving, T bili improved from 4.4 on admission to 2.5 today No indication for ERCP at this time Patient has been off Eliquis since 8/10 Timing of cholecystectomy per general surgery Diet as tolerated GI will sign off at this time, please call us back with questions or concerns   LOS: 2 days   Andrew Watts 11/07/2019, 3:43 PM

## 2019-11-08 ENCOUNTER — Inpatient Hospital Stay: Payer: Medicare Other

## 2019-11-08 LAB — GLUCOSE, CAPILLARY
Glucose-Capillary: 169 mg/dL — ABNORMAL HIGH (ref 70–99)
Glucose-Capillary: 179 mg/dL — ABNORMAL HIGH (ref 70–99)
Glucose-Capillary: 189 mg/dL — ABNORMAL HIGH (ref 70–99)
Glucose-Capillary: 190 mg/dL — ABNORMAL HIGH (ref 70–99)
Glucose-Capillary: 279 mg/dL — ABNORMAL HIGH (ref 70–99)
Glucose-Capillary: 297 mg/dL — ABNORMAL HIGH (ref 70–99)

## 2019-11-08 LAB — CBC
HCT: 37 % — ABNORMAL LOW (ref 39.0–52.0)
Hemoglobin: 12.4 g/dL — ABNORMAL LOW (ref 13.0–17.0)
MCH: 31.2 pg (ref 26.0–34.0)
MCHC: 33.5 g/dL (ref 30.0–36.0)
MCV: 93 fL (ref 80.0–100.0)
Platelets: 112 10*3/uL — ABNORMAL LOW (ref 150–400)
RBC: 3.98 MIL/uL — ABNORMAL LOW (ref 4.22–5.81)
RDW: 12.7 % (ref 11.5–15.5)
WBC: 9.9 10*3/uL (ref 4.0–10.5)
nRBC: 0 % (ref 0.0–0.2)

## 2019-11-08 LAB — BASIC METABOLIC PANEL
Anion gap: 6 (ref 5–15)
BUN: 30 mg/dL — ABNORMAL HIGH (ref 8–23)
CO2: 23 mmol/L (ref 22–32)
Calcium: 7.8 mg/dL — ABNORMAL LOW (ref 8.9–10.3)
Chloride: 102 mmol/L (ref 98–111)
Creatinine, Ser: 1.9 mg/dL — ABNORMAL HIGH (ref 0.61–1.24)
GFR calc Af Amer: 37 mL/min — ABNORMAL LOW (ref >60)
GFR calc non Af Amer: 32 mL/min — ABNORMAL LOW (ref >60)
Glucose, Bld: 238 mg/dL — ABNORMAL HIGH (ref 70–99)
Potassium: 4.3 mmol/L (ref 3.5–5.1)
Sodium: 131 mmol/L — ABNORMAL LOW (ref 135–145)

## 2019-11-08 LAB — HEPATIC FUNCTION PANEL
ALT: 200 U/L — ABNORMAL HIGH (ref 0–44)
AST: 27 U/L (ref 15–41)
Albumin: 2.6 g/dL — ABNORMAL LOW (ref 3.5–5.0)
Alkaline Phosphatase: 139 U/L — ABNORMAL HIGH (ref 38–126)
Bilirubin, Direct: 0.7 mg/dL — ABNORMAL HIGH (ref 0.0–0.2)
Indirect Bilirubin: 1.2 mg/dL — ABNORMAL HIGH (ref 0.3–0.9)
Total Bilirubin: 1.9 mg/dL — ABNORMAL HIGH (ref 0.3–1.2)
Total Protein: 6.1 g/dL — ABNORMAL LOW (ref 6.5–8.1)

## 2019-11-08 MED ORDER — BISACODYL 10 MG RE SUPP
10.0000 mg | Freq: Once | RECTAL | Status: AC
  Administered 2019-11-08: 10 mg via RECTAL
  Filled 2019-11-08: qty 1

## 2019-11-08 MED ORDER — PROCHLORPERAZINE EDISYLATE 10 MG/2ML IJ SOLN: 10.0000 mg | INTRAMUSCULAR | Status: DC | PRN

## 2019-11-08 MED ORDER — DOCUSATE SODIUM 100 MG PO CAPS
200.0000 mg | ORAL_CAPSULE | Freq: Once | ORAL | Status: AC
  Administered 2019-11-08: 200 mg via ORAL
  Filled 2019-11-08: qty 2

## 2019-11-08 MED ORDER — METOCLOPRAMIDE HCL 5 MG/ML IJ SOLN
5.0000 mg | Freq: Four times a day (QID) | INTRAMUSCULAR | Status: DC
  Administered 2019-11-08 – 2019-11-10 (×9): 5 mg via INTRAVENOUS
  Filled 2019-11-08 (×9): qty 2

## 2019-11-08 MED ORDER — POTASSIUM CHLORIDE 10 MEQ/100ML IV SOLN
10.0000 meq | INTRAVENOUS | Status: AC
  Administered 2019-11-08 (×3): 10 meq via INTRAVENOUS
  Filled 2019-11-08 (×3): qty 100

## 2019-11-08 NOTE — Progress Notes (Signed)
Pharmacy Antibiotic Note  Andrew Watts is a 85 y.o. male admitted on 11/05/2019 with cholecystitis.  Pharmacy has been consulted for Zosyn dosing.  Day #4  Plan: Zosyn 3.375g IV q8h (4 hour infusion). Will follow renal function closely, if decreases further will need to reduce to q12h.  Height: 5\' 11"  (180.3 cm) Weight: 69.4 kg (152 lb 14.4 oz) IBW/kg (Calculated) : 75.3  Temp (24hrs), Avg:98.1 F (36.7 C), Min:97.7 F (36.5 C), Max:98.4 F (36.9 C)  Recent Labs  Lab 11/04/19 2051 11/06/19 0556 11/07/19 0535 11/08/19 1225  WBC 9.9 6.9 5.5 9.9  CREATININE 2.57* 2.21* 2.06*  --     Estimated Creatinine Clearance: 26.7 mL/min (A) (by C-G formula based on SCr of 2.06 mg/dL (H)).    Allergies  Allergen Reactions  . Pioglitazone Shortness Of Breath    Antimicrobials this admission: Zosyn 8/10 >>     Thank you for allowing pharmacy to be a part of this patient's care.  11/10/19, PharmD 11/08/2019 2:25 PM

## 2019-11-08 NOTE — Progress Notes (Signed)
PROGRESS NOTE  Andrew Watts GMW:[id number] DOB: 26-Mar-1935 DOA: 11/05/2019 PCP: Noel Gerold, PA  Brief History   Andrew Watts is a 85 y.o. male with medical history significant for diabetes mellitus, coronary artery disease, CHF who was referred to the emergency room from the urgent care center for evaluation of nausea, vomiting and inability to keep any food down associated with pain in the right upper quadrant.  Patient has had symptoms for 2 days and sought medical help due to the refractory nature of his symptoms.  He rated his right upper quadrant pain a 6 x 10 in intensity at its worst.  He denied having any fever or chills.  He denied having any changes in his bowel habits, no chest pain, no shortness of breath, no palpitations or diaphoresis.  He denied having any cough or urinary symptoms. General evaluation patient appeared comfortable and stated that his pain had improved. Labs reveal a sodium of 131, glucose of 246, BUN of 59, creatinine of 2.57, AST 1392, ALT 657, white count of 9.9 with a left shift and hemoglobin of 14. Abdominal ultrasound showed findings suggestive of acute cholecystitis Twelve-lead EKG reviewed by me showed sinus rhythm with PVCs  ED Course: Patient is an 85 year old Caucasian male who was sent to the emergency room from urgent care for evaluation of right upper quadrant pain associated with nausea, vomiting and inability to tolerate oral intake.  Patient noted to have marked transaminitis and ultrasound suggestive of acute cholecystitis.  He will be admitted to the hospital for further evaluation.  The patient was admitted to a med surg bed. MRCP was performed and was negative for obstructive pathology. The patient has been evaluated by general surgery and he is being considered for possible laparoscopic cholecystectomy in a day or two vs a non-surgical approach to his illness.  He is receiving IV Zosyn.  I have discussed this patient with Dr. Hazle Quant  from General Surgery. He was advanced to a soft diet on 11/07/2019. He has been unable to tolerate it. Dr. Harlow Asa has seen the patient. The patient has been reduced to a full liquid diet, and he will be advanced again to a soft diet when he is able to tolerate it without pain or nausea. IV antibiotics will be continued. He will be ambulated.  The plan is still for the patient to follow up with surgery as outpatient for resolution of his cholecystectomy at another time.  Consultants  . Gastroenterology . General Surgery  Procedures  . MRCP  Antibiotics   Anti-infectives (From admission, onward)   Start     Dose/Rate Route Frequency Ordered Stop   11/05/19 1400  piperacillin-tazobactam (ZOSYN) IVPB 3.375 g     Discontinue     3.375 g 12.5 mL/hr over 240 Minutes Intravenous Every 8 hours 11/05/19 1018     11/05/19 0545  piperacillin-tazobactam (ZOSYN) IVPB 3.375 g        3.375 g 100 mL/hr over 30 Minutes Intravenous  Once 11/05/19 0541 11/05/19 1001      Subjective  The patient is sitting up in bed. He is complaining of nausea.  Objective   Vitals:  Vitals:   11/08/19 0527 11/08/19 1154  BP: 140/60 (!) 142/65  Pulse: 67 63  Resp: 18   Temp: 98.1 F (36.7 C) 97.7 F (36.5 C)  SpO2: 96% 90%    Exam:  Constitutional:  . The patient is awake, alert, and oriented x 3. No acute distress. Respiratory:  . No increased work  of breathing. . No wheezes, rales, or rhonchi . No tactile fremitus Cardiovascular:  . Regular rate and rhythm . No murmurs, ectopy, or gallups. . No lateral PMI. No thrills. Abdomen:  . Abdomen is soft, somewhat distended and tender in the epigastrum.  . No hernias, masses, or organomegaly . Hypoactive bowel sounds.  Musculoskeletal:  . No cyanosis, clubbing, or edema Skin:  . No rashes, lesions, ulcers . palpation of skin: no induration or nodules Neurologic:  . CN 2-12 intact . Sensation all 4 extremities intact Psychiatric:  . Mental  status o Mood, affect appropriate o Orientation to person, place, time  . judgment and insight appear intact  I have personally reviewed the following:   Today's Data  . Vitals, BMP, CBC  Micro Data  . Blood cultures x 2 pending.  Imaging  . MRCP  Scheduled Meds: . amiodarone  200 mg Oral Daily  . feeding supplement (GLUCERNA SHAKE)  237 mL Oral TID BM  . finasteride  5 mg Oral Daily  . insulin aspart  0-9 Units Subcutaneous Q4H  . metoCLOPramide (REGLAN) injection  5 mg Intravenous Q6H  . metoprolol tartrate  25 mg Oral BID  . pantoprazole (PROTONIX) IV  40 mg Intravenous Q24H  . terazosin  2 mg Oral Daily   Continuous Infusions: . sodium chloride 75 mL/hr at 11/08/19 1148  . piperacillin-tazobactam (ZOSYN)  IV 3.375 g (11/08/19 1604)    Principal Problem:   Acute cholecystitis Active Problems:   Type 2 diabetes mellitus with stage 3 chronic kidney disease (HCC)   AF (paroxysmal atrial fibrillation) (HCC)   Chronic systolic CHF (congestive heart failure) (HCC)   Dehydration, mild   Thrombocytopenia (HCC)   LOS: 3 days   A & P   Acute cholecystitis: Place patient on IV antibiotic therapy with Zosyn adjusted to renal function Supportive care with IV antiemetics, pain control and IV PPI. Follow-up results of blood cultures. MRCP was negative for obstructive pathology. General surgery has evaluated the patient and is considering cholecystectomy vs non-operative approach. I have discussed this patient with Dr. Hazle Quant from General Surgery.He was advanced to a soft diet on 11/07/2019. He has been unable to tolerate it. Dr. Harlow Asa has seen the patient. The patient has been reduced to a full liquid diet, and he will be advanced again to a soft diet when he is able to tolerate it without pain or nausea. IV antibiotics will be continued. He will be ambulated.  The plan is still for the patient to follow up with surgery as outpatient for resolution of his  cholecystectomy at another time.  Diabetes mellitus with complications of stage III chronic kidney disease: Monitor creatinine, elelctrolytes, and volume status. The patient is on a clear liquid diet. Glucoses are being followed with FSBS and SSI. He is currently tolerating a clear liquid diet.  Chronic systolic CHF: Patient's last 2D echocardiogram from 2021 showed an LVEF of 35 to 40%. Pt currently appears a little dry with elevation of his creatinine above baseline of 1.94 to 2.21. Hold diuretics for now we will monitor patient carefully for signs of volume overload.  Dehydration: BUN/Creatinine ration is 21.3. This is likely due to GI losses from nausea and vomiting. Continue 1/2 NS. DC glucose from fluids as the patient is tolerating clears.   History of paroxysmal atrial fibrillation: Patient is rate controlled on amiodarone and metoprolol at home. However this morning the patient's HR was low. For this reason amiodarone was held and metoprolol dose  was reduced to 50 mg bid. Continue to monitor heart rate and blood pressure carefully. Eliquis has been held pending any planned procedure.  Thrombocytopenia: Chronic. Platelets down to 109 today. Monitor. No anticoagulation.  I have seen and examined this patient myself. I have spent 34 minutes in his evaluation and care.  DVT prophylaxis: SCD Code Status: Full code Family Communication: Greater than 50% of time was spent discussing plan of care with patient at the bedside.  All questions and concerns have been addressed.  He verbalizes understanding and agrees with the plan. Disposition Plan: Back to previous home environment Consults called: Surgery  Jahaan Vanwagner, DO [facility] Direct contact: see [email or web address]  7PM-7AM contact night coverage as above 11/08/2019, 6:15 PM  LOS: 1 day

## 2019-11-08 NOTE — Progress Notes (Signed)
SURGICAL PROGRESS NOTE   Hospital Day(s): 3.   Post op day(s):  Marland Kitchen   Interval History: Patient seen and examined, no acute events or new complaints overnight. Patient reports he felt nauseous last night and this morning but at the moment of my evaluation he was starting to feel better after receiving Zofran.  There has been no fever.  At this moment there is no abdominal pain.  There is a pain radiation.  There is no alleviating or aggravating factor.  Vital signs in last 24 hours: [min-max] current  Temp:  [97.7 F (36.5 C)-98.4 F (36.9 C)] 97.7 F (36.5 C) (08/13 1154) Pulse Rate:  [55-67] 63 (08/13 1154) Resp:  [18-20] 18 (08/13 0527) BP: (140-143)/(60-65) 142/65 (08/13 1154) SpO2:  [90 %-96 %] 90 % (08/13 1154)     Height: 5\' 11"  (180.3 cm) Weight: 69.4 kg BMI (Calculated): 21.33   Physical Exam:  Constitutional: alert, cooperative and no distress  Respiratory: breathing non-labored at rest  Cardiovascular: regular rate and sinus rhythm  Gastrointestinal: soft, non-tender, and non-distended  Labs:  CBC Latest Ref Rng & Units 11/08/2019 11/07/2019 11/06/2019  WBC 4.0 - 10.5 K/uL 9.9 5.5 6.9  Hemoglobin 13.0 - 17.0 g/dL 12.4(L) 11.9(L) 12.4(L)  Hematocrit 39 - 52 % 37.0(L) 33.9(L) 34.7(L)  Platelets 150 - 400 K/uL 112(L) 104(L) 109(L)   CMP Latest Ref Rng & Units 11/08/2019 11/07/2019 11/06/2019  Glucose 70 - 99 mg/dL - 01/06/2020) 431(V)  BUN 8 - 23 mg/dL - 400(Q) 67(Y)  Creatinine 0.61 - 1.24 mg/dL - 19(J) 0.93(O)  Sodium 135 - 145 mmol/L - 135 138  Potassium 3.5 - 5.1 mmol/L - 3.6 2.9(L)  Chloride 98 - 111 mmol/L - 105 103  CO2 22 - 32 mmol/L - 22 23  Calcium 8.9 - 10.3 mg/dL - 8.0(L) 8.2(L)  Total Protein 6.5 - 8.1 g/dL 6.1(L) 6.1(L) 6.5  Total Bilirubin 0.3 - 1.2 mg/dL 6.71(I) 2.5(H) 4.7(H)  Alkaline Phos 38 - 126 U/L 139(H) 169(H) 194(H)  AST 15 - 41 U/L 27 87(H) 312(H)  ALT 0 - 44 U/L 200(H) 373(H) 624(H)    Imaging studies: No new pertinent imaging  studies   Assessment/Plan:  85 y.o.malewith acute cholecystitis, complicated by pertinent comorbidities includingpossible choledocholithiasis, CHF, atrial fibrillation on Eliquis, chronic kidney disease stage IIIb.  Patient with abdominal pain and nausea overnight.  At the moment of my evaluation at noon the patient reported feeling better without abdominal pain or nausea.  I have repeat labs and shows no leukocytosis and a bilirubin continue to decrease.  I agree with decreasing the diet to full liquids and assess toleration and then advance to soft if he continue without pain or nausea.  I will continue to follow along to assist in the management of this patient.  Continue IV antibiotic therapy.  I encouraged the patient to ambulate.  91, MD

## 2019-11-08 NOTE — Care Management Important Message (Signed)
Important Message  Patient Details  Name: Andrew Watts MRN: [id number] Date of Birth: 27-May-1934   Medicare Important Message Given:  Yes     Bernadette Hoit 11/08/2019, 1:55 PM

## 2019-11-09 LAB — BASIC METABOLIC PANEL
Anion gap: 8 (ref 5–15)
BUN: 28 mg/dL — ABNORMAL HIGH (ref 8–23)
CO2: 20 mmol/L — ABNORMAL LOW (ref 22–32)
Calcium: 7.8 mg/dL — ABNORMAL LOW (ref 8.9–10.3)
Chloride: 103 mmol/L (ref 98–111)
Creatinine, Ser: 2.06 mg/dL — ABNORMAL HIGH (ref 0.61–1.24)
GFR calc Af Amer: 34 mL/min — ABNORMAL LOW (ref >60)
GFR calc non Af Amer: 29 mL/min — ABNORMAL LOW (ref >60)
Glucose, Bld: 181 mg/dL — ABNORMAL HIGH (ref 70–99)
Potassium: 4.1 mmol/L (ref 3.5–5.1)
Sodium: 131 mmol/L — ABNORMAL LOW (ref 135–145)

## 2019-11-09 LAB — GLUCOSE, CAPILLARY
Glucose-Capillary: 181 mg/dL — ABNORMAL HIGH (ref 70–99)
Glucose-Capillary: 196 mg/dL — ABNORMAL HIGH (ref 70–99)
Glucose-Capillary: 197 mg/dL — ABNORMAL HIGH (ref 70–99)
Glucose-Capillary: 236 mg/dL — ABNORMAL HIGH (ref 70–99)
Glucose-Capillary: 256 mg/dL — ABNORMAL HIGH (ref 70–99)
Glucose-Capillary: 257 mg/dL — ABNORMAL HIGH (ref 70–99)

## 2019-11-09 NOTE — Progress Notes (Signed)
SURGICAL PROGRESS NOTE   Hospital Day(s): 4.   Post op day(s):  Marland Kitchen   Interval History: Patient seen and examined, no acute events or new complaints overnight. Patient reports feeling great.  He denies any abdominal pain nausea or vomiting.  There is no pain ideation.  There is no alleviating or aggravating factor.  Vital signs in last 24 hours: [min-max] current  Temp:  [97.7 F (36.5 C)-98.3 F (36.8 C)] 98.3 F (36.8 C) (08/14 0410) Pulse Rate:  [63-66] 64 (08/14 0410) Resp:  [20-24] 24 (08/14 0410) BP: (130-142)/(61-65) 140/61 (08/14 0410) SpO2:  [90 %-94 %] 93 % (08/14 0410)     Height: 5\' 11"  (180.3 cm) Weight: 69.4 kg BMI (Calculated): 21.33   Physical Exam:  Constitutional: alert, cooperative and no distress  Respiratory: breathing non-labored at rest  Cardiovascular: regular rate and sinus rhythm  Gastrointestinal: soft, non-tender, and non-distended  Labs:  CBC Latest Ref Rng & Units 11/08/2019 11/07/2019 11/06/2019  WBC 4.0 - 10.5 K/uL 9.9 5.5 6.9  Hemoglobin 13.0 - 17.0 g/dL 12.4(L) 11.9(L) 12.4(L)  Hematocrit 39 - 52 % 37.0(L) 33.9(L) 34.7(L)  Platelets 150 - 400 K/uL 112(L) 104(L) 109(L)   CMP Latest Ref Rng & Units 11/09/2019 11/08/2019 11/07/2019  Glucose 70 - 99 mg/dL 01/07/2020) 267(T) 245(Y)  BUN 8 - 23 mg/dL 099(I) 33(A) 25(K)  Creatinine 0.61 - 1.24 mg/dL 53(Z) 7.67(H) 4.19(F)  Sodium 135 - 145 mmol/L 131(L) 131(L) 135  Potassium 3.5 - 5.1 mmol/L 4.1 4.3 3.6  Chloride 98 - 111 mmol/L 103 102 105  CO2 22 - 32 mmol/L 20(L) 23 22  Calcium 8.9 - 10.3 mg/dL 7.8(L) 7.8(L) 8.0(L)  Total Protein 6.5 - 8.1 g/dL - 6.1(L) 6.1(L)  Total Bilirubin 0.3 - 1.2 mg/dL - 1.9(H) 2.5(H)  Alkaline Phos 38 - 126 U/L - 139(H) 169(H)  AST 15 - 41 U/L - 27 87(H)  ALT 0 - 44 U/L - 200(H) 373(H)    Imaging studies: No new pertinent imaging studies   Assessment/Plan:  85 y.o.malewith acute cholecystitis, complicated by pertinent comorbidities includingpossible  choledocholithiasis, CHF, atrial fibrillation on Eliquis, chronic kidney disease stage IIIb.  Patient this morning feeling great.  He denies any nausea or vomiting.  He denies any recurrent abdominal pain.  Patient tolerated diet yesterday.  If he tolerates soft diet today, he can be discharged from the surgical standpoint and I will see him in my office for discussion of surgical management of the gallbladder.  91, MD

## 2019-11-09 NOTE — Progress Notes (Signed)
PROGRESS NOTE  Andrew Watts HTD:[id number] DOB: 12/03/1934 DOA: 11/05/2019 PCP: Noel Gerold, PA  Brief History   Andrew Watts is a 85 y.o. male with medical history significant for diabetes mellitus, coronary artery disease, CHF who was referred to the emergency room from the urgent care center for evaluation of nausea, vomiting and inability to keep any food down associated with pain in the right upper quadrant.  Patient has had symptoms for 2 days and sought medical help due to the refractory nature of his symptoms.  He rated his right upper quadrant pain a 6 x 10 in intensity at its worst.  He denied having any fever or chills.  He denied having any changes in his bowel habits, no chest pain, no shortness of breath, no palpitations or diaphoresis.  He denied having any cough or urinary symptoms. General evaluation patient appeared comfortable and stated that his pain had improved.  Labs reveal a sodium of 131, glucose of 246, BUN of 59, creatinine of 2.57, AST 1392, ALT 657, white count of 9.9 with a left shift and hemoglobin of 14. Abdominal ultrasound showed findings suggestive of acute cholecystitis. Twelve-lead EKG reviewed by me showed sinus rhythm with PVCs  ED Course: Patient is an 85 year old Caucasian male who was sent to the emergency room from urgent care for evaluation of right upper quadrant pain associated with nausea, vomiting and inability to tolerate oral intake.  Patient noted to have marked transaminitis and ultrasound suggestive of acute cholecystitis.  He will be admitted to the hospital for further evaluation.  The patient was admitted to a med surg bed. MRCP was performed and was negative for obstructive pathology. The patient has been evaluated by general surgery and he is being considered for possible laparoscopic cholecystectomy in a day or two vs a non-surgical approach to his illness.  He is receiving IV Zosyn.  I have discussed this patient with Dr. Hazle Quant  from General Surgery. He was advanced to a soft diet on 11/07/2019. He has been unable to tolerate it. Dr. Harlow Asa has seen the patient. The patient has been reduced to a full liquid diet, and he will be advanced again to a soft diet when he is able to tolerate it without pain or nausea. IV antibiotics will be continued. He will be ambulated.  The plan is still for the patient to follow up with surgery as outpatient for resolution of his cholecystectomy at another time.  Consultants  . Gastroenterology . General Surgery  Procedures  . MRCP  Antibiotics   Anti-infectives (From admission, onward)   Start     Dose/Rate Route Frequency Ordered Stop   11/05/19 1400  piperacillin-tazobactam (ZOSYN) IVPB 3.375 g     Discontinue     3.375 g 12.5 mL/hr over 240 Minutes Intravenous Every 8 hours 11/05/19 1018     11/05/19 0545  piperacillin-tazobactam (ZOSYN) IVPB 3.375 g        3.375 g 100 mL/hr over 30 Minutes Intravenous  Once 11/05/19 0541 11/05/19 1001      Subjective  The patient is sitting up in bed. He states that he is feeling better.   Objective   Vitals:  Vitals:   11/08/19 1943 11/09/19 0410  BP: 130/62 140/61  Pulse: 66 64  Resp: 20 (!) 24  Temp: 98.2 F (36.8 C) 98.3 F (36.8 C)  SpO2: 94% 93%    Exam:  Constitutional:  . The patient is awake, alert, and oriented x 3. No acute distress. Respiratory:  .  No increased work of breathing. . No wheezes, rales, or rhonchi . No tactile fremitus Cardiovascular:  . Regular rate and rhythm . No murmurs, ectopy, or gallups. . No lateral PMI. No thrills. Abdomen:  . Abdomen is soft, non-distended,  and non-tender. . No hernias, masses, or organomegaly . Hypoactive bowel sounds.  Musculoskeletal:  . No cyanosis, clubbing, or edema Skin:  . No rashes, lesions, ulcers . palpation of skin: no induration or nodules Neurologic:  . CN 2-12 intact . Sensation all 4 extremities intact Psychiatric:  . Mental  status o Mood, affect appropriate o Orientation to person, place, time  . judgment and insight appear intact  I have personally reviewed the following:   Today's Data  . Vitals, BMP, CBC  Micro Data  . Blood cultures x 2 pending.  Imaging  . MRCP  Scheduled Meds: . amiodarone  200 mg Oral Daily  . feeding supplement (GLUCERNA SHAKE)  237 mL Oral TID BM  . finasteride  5 mg Oral Daily  . insulin aspart  0-9 Units Subcutaneous Q4H  . metoCLOPramide (REGLAN) injection  5 mg Intravenous Q6H  . metoprolol tartrate  25 mg Oral BID  . pantoprazole (PROTONIX) IV  40 mg Intravenous Q24H  . terazosin  2 mg Oral Daily   Continuous Infusions: . sodium chloride 75 mL/hr at 11/09/19 0957  . piperacillin-tazobactam (ZOSYN)  IV 3.375 g (11/09/19 1504)    Principal Problem:   Acute cholecystitis Active Problems:   Type 2 diabetes mellitus with stage 3 chronic kidney disease (HCC)   AF (paroxysmal atrial fibrillation) (HCC)   Chronic systolic CHF (congestive heart failure) (HCC)   Dehydration, mild   Thrombocytopenia (HCC)   LOS: 4 days   A & P   Acute cholecystitis: Place patient on IV antibiotic therapy with Zosyn adjusted to renal function. The patient has received. supportive care with IV antiemetics, pain control and IV PPI. Follow-up results of blood cultures. MRCP was negative for obstructive pathology. General surgery has evaluated the patient and is considering cholecystectomy vs non-operative approach. I have discussed this patient with Dr. Hazle Quant from General Surgery.He was advanced to a soft diet on 11/07/2019. He has been unable to tolerate it. Dr. Harlow Asa has seen the patient. The patient was been reduced to a full liquid diet on 11/08/2019. I have advanced him again to a soft diet. If he tolerates this he may be discharged to home tomorrow.  Diabetes mellitus with complications of stage III chronic kidney disease: Monitor creatinine, elelctrolytes, and volume  status. The patient is on a clear liquid diet. Glucoses are being followed with FSBS and SSI. He is currently tolerating a clear liquid diet.  Chronic systolic CHF: Patient's last 2D echocardiogram from 2021 showed an LVEF of 35 to 40%. Pt currently appears a little dry with elevation of his creatinine above baseline of 1.94 to 2.21. Hold diuretics for now we will monitor patient carefully for signs of volume overload.  Dehydration: BUN/Creatinine ration is 21.3. This is likely due to GI losses from nausea and vomiting. Continue 1/2 NS. DC glucose from fluids as the patient is tolerating clears.   History of paroxysmal atrial fibrillation: Patient is rate controlled on amiodarone and metoprolol at home. However this morning the patient's HR was low. For this reason amiodarone was held and metoprolol dose was reduced to 50 mg bid. Continue to monitor heart rate and blood pressure carefully. Eliquis has been held pending any planned procedure.  Thrombocytopenia: Chronic. Platelets down  to 109 today. Monitor. No anticoagulation.  I have seen and examined this patient myself. I have spent 32 minutes in his evaluation and care.  DVT prophylaxis: SCD Code Status: Full code Family Communication: Greater than 50% of time was spent discussing plan of care with patient at the bedside.  All questions and concerns have been addressed.  He verbalizes understanding and agrees with the plan. Disposition Plan: Back to previous home environment  Status is: Inpatient  Remains inpatient appropriate because:Inpatient level of care appropriate due to severity of illness   Dispo: The patient is from: Home              Anticipated d/c is to: Home              Anticipated d/c date is: 1 day              Patient currently is not medically stable to d/c.  Andrew Ibsen, DO [facility] Direct contact: see [email or web address]  7PM-7AM contact night coverage as above 11/09/2019, 4:10 PM  LOS: 1 day

## 2019-11-10 LAB — GLUCOSE, CAPILLARY
Glucose-Capillary: 170 mg/dL — ABNORMAL HIGH (ref 70–99)
Glucose-Capillary: 185 mg/dL — ABNORMAL HIGH (ref 70–99)
Glucose-Capillary: 235 mg/dL — ABNORMAL HIGH (ref 70–99)
Glucose-Capillary: 253 mg/dL — ABNORMAL HIGH (ref 70–99)

## 2019-11-10 LAB — BASIC METABOLIC PANEL
Anion gap: 10 (ref 5–15)
BUN: 27 mg/dL — ABNORMAL HIGH (ref 8–23)
CO2: 20 mmol/L — ABNORMAL LOW (ref 22–32)
Calcium: 7.8 mg/dL — ABNORMAL LOW (ref 8.9–10.3)
Chloride: 104 mmol/L (ref 98–111)
Creatinine, Ser: 1.8 mg/dL — ABNORMAL HIGH (ref 0.61–1.24)
GFR calc Af Amer: 39 mL/min — ABNORMAL LOW (ref >60)
GFR calc non Af Amer: 34 mL/min — ABNORMAL LOW (ref >60)
Glucose, Bld: 184 mg/dL — ABNORMAL HIGH (ref 70–99)
Potassium: 3.8 mmol/L (ref 3.5–5.1)
Sodium: 134 mmol/L — ABNORMAL LOW (ref 135–145)

## 2019-11-10 MED ORDER — AMOXICILLIN-POT CLAVULANATE 875-125 MG PO TABS
1.0000 | ORAL_TABLET | Freq: Two times a day (BID) | ORAL | 0 refills | Status: AC
Start: 2019-11-10 — End: 2019-11-15

## 2019-11-10 MED ORDER — METOPROLOL TARTRATE 25 MG PO TABS: 25.0000 mg | ORAL_TABLET | Freq: Two times a day (BID) | ORAL | 0 refills | Status: AC

## 2019-11-11 NOTE — Discharge Summary (Signed)
Physician Discharge Summary  Andrew Watts ZOX:[id number] DOB: 1935/01/28 DOA: 11/05/2019  PCP: Noel GeroldMinton, Robert, PA  Admit date: 11/05/2019 Discharge date: 11/11/2019  Recommendations for Outpatient Follow-up:  1. Discharge to home 2. Follow up with PCP in 7-10 days. 3. Check chemistry on that visit. 4. Follow up with general surgery as directed by Dr. Tommi Rumpsitron-Diaz.   Follow-up Information    Noel GeroldMinton, Robert, [place] Follow up in 7 day(s).   Why: Call and make a follow-up appointment with your primary care doctor within 7-10 days Contact information: [address] [place] [address] [phone]        Carolan Shiverintron-Diaz, Edgardo, MD. Schedule an appointment as soon as possible for a visit in 2 week(s).   Specialty: General Surgery Why: Call Monday 8/16 to make follow appointment to be seen in 2 weeks. Contact information: [address] [place] [address] [phone]              Discharge Diagnoses: Principal diagnosis is #1 1. Acute cholecystitis 2. Diabetes melllitus  3. CKD III 4. Chronic systolic CHF 5. Dehydration 6. paroxismal atrial fibrillation 7. thrombocytopenia  Discharge Condition: Fair  Disposition: Home  Diet recommendation: Heart healthy  Filed Weights   11/04/19 2316 11/05/19 2127  Weight: 70.3 kg 69.4 kg    History of present illness:   Andrew Rubinsrchie Boehm is a 85 y.o. male with medical history significant for diabetes mellitus, coronary artery disease, CHF who was referred to the emergency room from the urgent care center for evaluation of nausea, vomiting and inability to keep any food down associated with pain in the right upper quadrant.  Patient has had symptoms for 2 days and sought medical help due to the refractory nature of his symptoms.  He rated his right upper quadrant pain a 6 x 10 in intensity at its worst.  He denied having any fever or chills.  He denied having any changes in his bowel habits, no chest pain, no shortness of breath,  no palpitations or diaphoresis.  He denied having any cough or urinary symptoms. General evaluation patient appeared comfortable and stated that his pain had improved. Labs reveal a sodium of 131, glucose of 246, BUN of 59, creatinine of 2.57, AST 1392, ALT 657, white count of 9.9 with a left shift and hemoglobin of 14. Abdominal ultrasound showed findings suggestive of acute cholecystitis Twelve-lead EKG reviewed by me showed sinus rhythm with PVCs   ED Course: Patient is an 85 year old Caucasian male who was sent to the emergency room from urgent care for evaluation of right upper quadrant pain associated with nausea, vomiting and inability to tolerate oral intake.  Patient noted to have marked transaminitis and ultrasound suggestive of acute cholecystitis.  He will be admitted to the hospital for further evaluation.  Hospital Course:  The patient was admitted to a med surg bed. MRCP was performed and was negative for obstructive pathology. The patient has been evaluated by general surgery and he is being considered for possible laparoscopic cholecystectomy in a day or two vs a non-surgical approach to his illness.  He is receiving IV Zosyn.  I have discussed this patient with Dr. Hazle Quantintron-Diaz from General Surgery. He was advanced to a soft diet on 11/07/2019. He has been unable to tolerate it. Dr. Harlow Asaitrone-Diaz has seen the patient. The patient has been reduced to a full liquid diet, and he will be advanced again to a soft diet when he is able to tolerate it without pain or nausea. IV antibiotics  will be continued. He will be ambulated.  The plan is still for the patient to follow up with surgery as outpatient for resolution of his cholecystectomy at another time.  On the date of discharge the patient denies abdominal pain or nausea. He is tolerating a regular diet. He does complain of some loose stools, but feels that this is largely due to his liquid diet.  Today's assessment: S: The  patient is resting comfortably. No new complaints. O: Vitals:  Vitals:   11/09/19 2040 11/10/19 0346  BP: 140/74 (!) 130/59  Pulse: 66 (!) 59  Resp: (!) 24 20  Temp: 98.8 F (37.1 C) 98.3 F (36.8 C)  SpO2: 93% 91%   Exam:  Constitutional:   The patient is awake, alert, and oriented x 3. No acute distress. Respiratory:   No increased work of breathing.  No wheezes, rales, or rhonchi  No tactile fremitus Cardiovascular:   Regular rate and rhythm  No murmurs, ectopy, or gallups.  No lateral PMI. No thrills. Abdomen:   Abdomen is soft, non-distended,  and non-tender.  No hernias, masses, or organomegaly  Hypoactive bowel sounds.  Musculoskeletal:   No cyanosis, clubbing, or edema Skin:   No rashes, lesions, ulcers  palpation of skin: no induration or nodules Neurologic:   CN 2-12 intact  Sensation all 4 extremities intact Psychiatric:   Mental status ? Mood, affect appropriate ? Orientation to person, place, time   judgment and insight appear intact  Discharge Instructions  Discharge Instructions    Activity as tolerated - No restrictions   Complete by: As directed    Call MD for:  persistant nausea and vomiting   Complete by: As directed    Call MD for:  severe uncontrolled pain   Complete by: As directed    Call MD for:  temperature >100.4   Complete by: As directed    Diet - low sodium heart healthy   Complete by: As directed    Discharge instructions   Complete by: As directed    Discharge to home Follow up with PCP in 7-10 days. Check chemistry on that visit. Follow up with general surgery as directed by Dr. Tommi Rumps.   Increase activity slowly   Complete by: As directed      Allergies as of 11/10/2019      Reactions   Pioglitazone Shortness Of Breath      Medication List    STOP taking these medications   amLODipine 10 MG tablet Commonly known as: NORVASC   lisinopril 10 MG tablet Commonly known as: ZESTRIL      TAKE these medications   amiodarone 200 MG tablet Commonly known as: PACERONE Take 200 mg by mouth daily. Notes to patient: Morning 11/11/19   amoxicillin-clavulanate 875-125 MG tablet Commonly known as: Augmentin Take 1 tablet by mouth 2 (two) times daily for 5 days. Notes to patient: Before bed 11/10/19   apixaban 2.5 MG Tabs tablet Commonly known as: ELIQUIS Take 1 tablet (2.5 mg total) by mouth 2 (two) times daily. Notes to patient: Before bed 11/10/19   empagliflozin 25 MG Tabs tablet Commonly known as: JARDIANCE Take 12.5 mg by mouth daily. Notes to patient: Morning 11/11/19   feeding supplement (NEPRO CARB STEADY) Liqd Take 237 mLs by mouth 2 (two) times daily between meals. Notes to patient: Afternoon 11/10/19   ferrous sulfate 325 (65 FE) MG tablet Take 325 mg by mouth every other day. Notes to patient: Morning 11/11/19   finasteride 5  MG tablet Commonly known as: PROSCAR Take 5 mg by mouth daily. Notes to patient: Morning 11/11/19   furosemide 40 MG tablet Commonly known as: Lasix Take 1 tablet (40 mg total) by mouth daily. Notes to patient: Morning 11/11/19   metoprolol tartrate 25 MG tablet Commonly known as: LOPRESSOR Take 1 tablet (25 mg total) by mouth 2 (two) times daily. What changed:   medication strength  how much to take Notes to patient: Before bed 11/10/19   simvastatin 20 MG tablet Commonly known as: ZOCOR Take 20 mg by mouth daily. Notes to patient: Morning 11/11/19   terazosin 2 MG capsule Commonly known as: HYTRIN Take 2 mg by mouth daily. Notes to patient: Morning 11/11/19      Allergies  Allergen Reactions  . Pioglitazone Shortness Of Breath    The results of significant diagnostics from this hospitalization (including imaging, microbiology, ancillary and laboratory) are listed below for reference.    Significant Diagnostic Studies: DG Abd 1 View  Result Date: 11/08/2019 CLINICAL DATA:  Nausea today.  Vomiting yesterday.   Cholecystitis. EXAM: ABDOMEN - 1 VIEW COMPARISON:  Abdomen MRI dated 11/05/2019. FINDINGS: Normal bowel gas pattern. Lumbar and lower thoracic spine degenerative changes with mild to moderate scoliosis. IMPRESSION: No acute abnormality. Electronically Signed   By: Beckie Salts M.D.   On: 11/08/2019 11:34   MR ABDOMEN MRCP W WO CONTAST  Result Date: 11/05/2019 CLINICAL DATA:  Abdominal pain with suspected biliary obstruction. EXAM: MRI ABDOMEN WITHOUT AND WITH CONTRAST (INCLUDING MRCP) TECHNIQUE: Multiplanar multisequence MR imaging of the abdomen was performed both before and after the administration of intravenous contrast. Heavily T2-weighted images of the biliary and pancreatic ducts were obtained, and three-dimensional MRCP images were rendered by post processing. CONTRAST:  86mL GADAVIST GADOBUTROL 1 MMOL/ML IV SOLN COMPARISON:  Ultrasound exam 11/05/2019. FINDINGS: Lower chest: Dependent atelectasis noted in the lower lobes. Hepatobiliary: No suspicious focal abnormality within the liver parenchyma. Gallbladder is distended with irregular diffuse gallbladder wall thickening and pericholecystic edema. Gallstones seen on ultrasound are not readily evident by MRI. No intra or extrahepatic biliary duct dilatation. No choledocholithiasis. Pancreas: 8 mm simple cystic lesion is identified in the pancreas near the junction of the body and tail. There is a second tiny 4 mm cyst in the body of pancreas. No dilatation of the main duct. Spleen:  No splenomegaly. No focal mass lesion. Adrenals/Urinary Tract: No adrenal nodule or mass. Kidneys unremarkable. Stomach/Bowel: Stomach is unremarkable. No gastric wall thickening. No evidence of outlet obstruction. Duodenum is normally positioned as is the ligament of Treitz. No small bowel or colonic dilatation within the visualized abdomen. Vascular/Lymphatic: No abdominal aortic aneurysm There is no gastrohepatic or hepatoduodenal ligament lymphadenopathy. No  retroperitoneal or mesenteric lymphadenopathy. Other:  No intraperitoneal free fluid. Musculoskeletal: No focal suspicious marrow enhancement within the visualized bony anatomy. IMPRESSION: 1. Gallbladder distension with diffuse, irregular gallbladder wall thickening and pericholecystic edema. Although no gallstones are evident by MRI, several were identified on recent ultrasound earlier today. 2. No intra or extrahepatic biliary duct dilatation. No evidence of choledocholithiasis. Of note, if the gallstones seen on ultrasound are in apparent by MRI, ductal stones may also be occult. 3. 2 tiny cystic lesions in the pancreas measuring 4 and 8 mm. These are likely benign. Consensus guidelines suggest that for cystic lesions of this size in a patient of this age, repeat imaging in 2 years is warranted. This recommendation follows ACR consensus guidelines: Management of Incidental Pancreatic Cysts: A White  Paper of the ACR Incidental Findings Committee. J Am Coll Radiol 2017;14:911-923. Electronically Signed   By: Kennith Center M.D.   On: 11/05/2019 12:46   US ABDOMEN LIMITED RUQ  Result Date: 11/05/2019 CLINICAL DATA:  Transaminitis EXAM: ULTRASOUND ABDOMEN LIMITED RIGHT UPPER QUADRANT COMPARISON:  None. FINDINGS: Gallbladder: Thickened gallbladder wall is seen measuring up to 8.5 mm. Layering small stones or sludge is seen. The largest calculi measures 6 mm. No sonographic Eulah Pont sign is seen. Possible trace pericholecystic fluid. Common bile duct: Diameter: 5 mm Liver: No focal lesion identified. Within normal limits in parenchymal echogenicity. Portal vein is patent on color Doppler imaging with normal direction of blood flow towards the liver. Other: None. IMPRESSION: Findings which could be suggestive of acute cholecystitis Electronically Signed   By: Jonna Clark M.D.   On: 11/05/2019 00:41    Microbiology: Recent Results (from the past 240 hour(s))  SARS Coronavirus 2 by RT PCR (hospital order,  performed in [facility] hospital lab) Nasopharyngeal Nasopharyngeal Swab     Status: None   Collection Time: 11/05/19  5:26 PM   Specimen: Nasopharyngeal Swab  Result Value Ref Range Status   SARS Coronavirus 2 NEGATIVE NEGATIVE Final    Comment: (NOTE) SARS-CoV-2 target nucleic acids are NOT DETECTED.  The SARS-CoV-2 RNA is generally detectable in upper and lower respiratory specimens during the acute phase of infection. The lowest concentration of SARS-CoV-2 viral copies this assay can detect is 250 copies / mL. A negative result does not preclude SARS-CoV-2 infection and should not be used as the sole basis for treatment or other patient management decisions.  A negative result may occur with improper specimen collection / handling, submission of specimen other than nasopharyngeal swab, presence of viral mutation(s) within the areas targeted by this assay, and inadequate number of viral copies (<250 copies / mL). A negative result must be combined with clinical observations, patient history, and epidemiological information.  Fact Sheet for Patients:   BoilerBrush.com.cy  Fact Sheet for Healthcare Providers: https://pope.com/  This test is not yet approved or  cleared by the [place] FDA and has been authorized for detection and/or diagnosis of SARS-CoV-2 by FDA under an Emergency Use Authorization (EUA).  This EUA will remain in effect (meaning this test can be used) for the duration of the COVID-19 declaration under Section 564(b)(1) of the Act, 21 U.S.C. section 360bbb-3(b)(1), unless the authorization is terminated or revoked sooner.  Performed at [facility], [address] [place], [place] [address]      Labs: Basic Metabolic Panel: Recent Labs  Lab 11/06/19 0556 11/07/19 0535 11/08/19 2316 11/09/19 0622 11/10/19 1113  NA 138 135 131* 131* 134*  K 2.9* 3.6 4.3 4.1 3.8  CL 103 105 102 103 104   CO2 20* 20*  GLUCOSE 166* 140* 238* 181* 184*  BUN 47* 33* 30* 28* 27*  CREATININE 2.21* 2.06* 1.90* 2.06* 1.80*  CALCIUM 8.2* 8.0* 7.8* 7.8* 7.8*   Liver Function Tests: Recent Labs  Lab 11/04/19 2051 11/06/19 0556 11/07/19 0535 11/08/19 1225  AST 1,392* 312* 87* 27  ALT 657* 624* 373* 200*  ALKPHOS 197* 194* 169* 139*  BILITOT 4.4* 4.7* 2.5* 1.9*  PROT 8.0 6.5 6.1* 6.1*  ALBUMIN 3.9 3.0* 2.7* 2.6*   Recent Labs  Lab 11/04/19 2326  LIPASE 21   No results for input(s): AMMONIA in the last 168 hours. CBC: Recent Labs  Lab 11/04/19 2051 11/06/19 0556 11/07/19 0535 11/08/19 1225  WBC 9.9 6.9 5.5 9.9  NEUTROABS 9.0*  --   --   --   HGB 14.4 12.4* 11.9* 12.4*  HCT 43.1 34.7* 33.9* 37.0*  MCV 93.3 89.2 90.2 93.0  PLT 119* 109* 104* 112*   Cardiac Enzymes: No results for input(s): CKTOTAL, CKMB, CKMBINDEX, TROPONINI in the last 168 hours. BNP: BNP (last 3 results) Recent Labs    05/14/19 1421  BNP 1,017.0*    ProBNP (last 3 results) No results for input(s): PROBNP in the last 8760 hours.  CBG: Recent Labs  Lab 11/09/19 2036 11/10/19 0009 11/10/19 0342 11/10/19 0806 11/10/19 1145  GLUCAP 256* 253* 235* 185* 170*    Principal Problem:   Acute cholecystitis Active Problems:   Type 2 diabetes mellitus with stage 3 chronic kidney disease (HCC)   AF (paroxysmal atrial fibrillation) (HCC)   Chronic systolic CHF (congestive heart failure) (HCC)   Dehydration, mild   Thrombocytopenia (HCC)   Time coordinating discharge: 38 minutes  Signed:        Gitel Beste, DO [facility]  11/11/2019, 8:19 AM

## 2019-12-10 ENCOUNTER — Ambulatory Visit: Payer: Self-pay | Admitting: General Surgery

## 2019-12-10 NOTE — H&P (View-Only) (Signed)
PATIENT PROFILE: Andrew Watts is a 85 y.o. male who presents to the Clinic for evaluation of cholelithiasis.  PCP:  [facility] [address]  HISTORY OF PRESENT ILLNESS: [name] had recent history of choledocholithiasis. He was admitted to the hospital with elevated bilirubin. MRCP was done and no stone was visualized on the common bile duct. Bilirubin slowly went down to normal. Patient was discharged without abdominal pain.   Today patient denies any recurrent abdominal pain. Denies pain radiation. There is no alleviating or aggravating factors. There is no nausea or vomiting.   Patient has history of atrial fibrillation and CHF. Today patient denies chest pain or shortness of breath.    PROBLEM LIST:        Problem List  Date Reviewed: 09/25/2019       Noted   Chronic ischemic heart disease 09/25/2019   Other acquired hammer toe 09/25/2019   Thyroid nodule 09/25/2019   Atrial fibrillation with RVR (CMS-HCC) 05/23/2019   Benign prostatic hyperplasia 05/23/2019   Chronic bilateral pleural effusions 05/23/2019   Chronic renal failure, stage 3b (CMS-HCC) 05/23/2019   Hyperlipidemia 05/23/2019   Type 2 diabetes mellitus with hyperlipidemia (CMS-HCC) 05/23/2019   COVID-19 10/15/2018      GENERAL REVIEW OF SYSTEMS:   General ROS: negative for - chills, fatigue, fever, weight gain or weight loss Allergy and Immunology ROS: negative for - hives  Hematological and Lymphatic ROS: negative for - bleeding problems or bruising, negative for palpable nodes Endocrine ROS: negative for - heat or cold intolerance, hair changes Respiratory ROS: negative for - cough, shortness of breath or wheezing Cardiovascular ROS: no chest pain or palpitations GI ROS: negative for nausea, vomiting, abdominal pain, diarrhea, constipation Musculoskeletal ROS: negative for - joint swelling or muscle pain Neurological ROS: negative for - confusion,  syncope Dermatological ROS: negative for pruritus and rash Psychiatric: negative for anxiety, depression, difficulty sleeping and memory loss  MEDICATIONS: Current Medications        Current Outpatient Medications  Medication Sig Dispense Refill  . AMIOdarone (PACERONE) 200 MG tablet Take 1 tablet by mouth once daily    . amLODIPine (NORVASC) 10 MG tablet TAKE ONE-HALF TABLET BY MOUTH EVERY DAY FOR BLOOD PRESSURE    . apixaban (ELIQUIS) 2.5 mg tablet Take 1 tablet by mouth 2 (two) times daily    . empagliflozin (JARDIANCE) 25 mg tablet TAKE ONE-HALF TABLET BY MOUTH EVERY DAY FOR DIABETES    . ferrous sulfate 325 (65 FE) MG tablet TAKE ONE TABLET BY MOUTH EVERY OTHER DAY FOR IRON REPLACEMENT    . finasteride (PROSCAR) 5 mg tablet Take 1 tablet by mouth once daily    . FUROsemide (LASIX) 40 MG tablet Take 1 tablet by mouth once daily    . lisinopriL (ZESTRIL) 10 MG tablet TAKE ONE-HALF TABLET BY MOUTH DAILY FBP FOR BLOOD PRESSURE    . metoprolol tartrate (LOPRESSOR) 100 MG tablet Take 25 mg by mouth 2 (two) times daily       . simvastatin (ZOCOR) 20 MG tablet Take 1 tablet by mouth once daily    . terazosin (HYTRIN) 2 MG capsule Take 1 capsule by mouth once daily     No current facility-administered medications for this visit.      ALLERGIES: Pioglitazone  PAST MEDICAL HISTORY:     Past Medical History:  Diagnosis Date  . Chronic kidney disease   . Diabetes mellitus without complication (CMS-HCC)   . Hypertension  PAST SURGICAL HISTORY: History reviewed. No pertinent surgical history.   FAMILY HISTORY: History reviewed. No pertinent family history.   SOCIAL HISTORY: Social History          Socioeconomic History  . Marital status: Married    Spouse name: Not on file  . Number of children: Not on file  . Years of education: Not on file  . Highest education level: Not on file  Occupational History  . Not on file  Tobacco Use   . Smoking status: Former Research scientist (life sciences)  . Smokeless tobacco: Never Used  Substance and Sexual Activity  . Alcohol use: Never  . Drug use: Never  . Sexual activity: Never  Other Topics Concern  . Not on file  Social History Narrative  . Not on file   Social Determinants of Health      Financial Resource Strain:   . Difficulty of Paying Living Expenses:   Food Insecurity:   . Worried About Charity fundraiser in the Last Year:   . Arboriculturist in the Last Year:   Transportation Needs:   . Film/video editor (Medical):   Marland Kitchen Lack of Transportation (Non-Medical):       PHYSICAL EXAM:    Vitals:   11/26/19 1108  BP: 133/68  Pulse: 53   Body mass index is 22.32 kg/m. Weight: 72.6 kg (160 lb)   GENERAL: Alert, active, oriented x3  HEENT: Pupils equal reactive to light. Extraocular movements are intact. Sclera clear. Palpebral conjunctiva normal red color.Pharynx clear.  NECK: Supple with no palpable mass and no adenopathy.  LUNGS: Sound clear with no rales rhonchi or wheezes.  HEART: Regular rhythm S1 and S2 without murmur.  ABDOMEN: Soft and depressible, nontender with no palpable mass, no hepatomegaly.   EXTREMITIES: Well-developed well-nourished symmetrical with no dependent edema.  NEUROLOGICAL: Awake alert oriented, facial expression symmetrical, moving all extremities.  REVIEW OF DATA: I have reviewed the following data today:      Office Visit on 11/26/2019  Component Date Value  . WBC (White Blood Cell Co* 11/26/2019 8.2   . RBC (Red Blood Cell Coun* 11/26/2019 4.53*  . Hemoglobin 11/26/2019 13.7*  . Hematocrit 11/26/2019 43.2   . MCV (Mean Corpuscular Vo* 11/26/2019 95.4   . MCH (Mean Corpuscular He* 11/26/2019 30.2   . MCHC (Mean Corpuscular H* 11/26/2019 31.7*  . Platelet Count 11/26/2019 218   . RDW-CV (Red Cell Distrib* 11/26/2019 12.9   . MPV (Mean Platelet Volum* 11/26/2019 10.4   . Neutrophils 11/26/2019 5.71   . Lymphocytes  11/26/2019 1.60   . Monocytes 11/26/2019 0.67   . Eosinophils 11/26/2019 0.06   . Basophils 11/26/2019 0.10*  . Neutrophil % 11/26/2019 69.9   . Lymphocyte % 11/26/2019 19.6   . Monocyte % 11/26/2019 8.2   . Eosinophil % 11/26/2019 0.7*  . Basophil% 11/26/2019 1.2   . Immature Granulocyte % 11/26/2019 0.4   . Immature Granulocyte Cou* 11/26/2019 0.03   . Glucose 11/26/2019 247*  . Sodium 11/26/2019 137   . Potassium 11/26/2019 4.9   . Chloride 11/26/2019 103   . Carbon Dioxide (CO2) 11/26/2019 28.2   . Urea Nitrogen (BUN) 11/26/2019 44*  . Creatinine 11/26/2019 2.0*  . Glomerular Filtration Ra* 11/26/2019 32*  . Calcium 11/26/2019 9.7   . AST  11/26/2019 20   . ALT  11/26/2019 23   . Alk Phos (alkaline Phosp* 11/26/2019 105*  . Albumin 11/26/2019 4.0   . Bilirubin, Total 11/26/2019 0.7   .  Protein, Total 11/26/2019 6.9   . A/G Ratio 11/26/2019 1.4      ASSESSMENT: Mr. Dunnigan is a 85 y.o. male presenting for consultation for cholelithiasis / biliary dyskinesia.    Patient was oriented about the diagnosis of cholelithiasis. Also oriented about what is the gallbladder, its anatomy and function and the implications of having stones. The patient was oriented about the treatment alternatives (observation vs cholecystectomy). Patient was oriented that a low percentage of patient will continue to have similar pain symptoms even after the gallbladder is removed. Surgical technique (open vs laparoscopic) was discussed. It was also discussed the goals of the surgery (decrease the pain episodes and avoid the risk of cholecystitis) and the risk of surgery including: bleeding, infection, common bile duct injury, stone retention, injury to other organs such as bowel, liver, stomach, other complications such as hernia, bowel obstruction among others. Also discussed with patient about anesthesia and its complications such as: reaction to medications, pneumonia, heart complications, death, among  others.  Due to recent history of choledocholithiasis I consider that it is recommended to proceed with cholecystectomy to avoid future recurrence of choledocholithiasis and its complications such as cholangitis. Also will decrease the risk of urgent/emergent surgery if patient comes with cholecystitis.   Will ask for cardiac clearance to coordinate for surgery.   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 Robotic assisted laparoscopic cholecystectomy (61950) 2.  CBC, CMP 3.  Cardiology clearance (Dr. Ubaldo Glassing) 4.  Will need to hold Eliquis 48 hours before the surgery  5.  Contact us if has any question or concern.  Patient and his wife verbalized understanding, all questions were answered, and were agreeable with the plan outlined above.     Herbert Pun, MD

## 2019-12-10 NOTE — H&P (Signed)
PATIENT PROFILE: Andrew Watts is a 85 y.o. male who presents to the Clinic for evaluation of cholelithiasis.  PCP:  [facility]  HISTORY OF PRESENT ILLNESS: [name] had recent history of choledocholithiasis. He was admitted to the hospital with elevated bilirubin. MRCP was done and no stone was visualized on the common bile duct. Bilirubin slowly went down to normal. Patient was discharged without abdominal pain.   Today patient denies any recurrent abdominal pain. Denies pain radiation. There is no alleviating or aggravating factors. There is no nausea or vomiting.   Patient has history of atrial fibrillation and CHF. Today patient denies chest pain or shortness of breath.    PROBLEM LIST:        Problem List  Date Reviewed: 09/25/2019       Noted   Chronic ischemic heart disease 09/25/2019   Other acquired hammer toe 09/25/2019   Thyroid nodule 09/25/2019   Atrial fibrillation with RVR (CMS-HCC) 05/23/2019   Benign prostatic hyperplasia 05/23/2019   Chronic bilateral pleural effusions 05/23/2019   Chronic renal failure, stage 3b (CMS-HCC) 05/23/2019   Hyperlipidemia 05/23/2019   Type 2 diabetes mellitus with hyperlipidemia (CMS-HCC) 05/23/2019   COVID-19 10/15/2018      GENERAL REVIEW OF SYSTEMS:   General ROS: negative for - chills, fatigue, fever, weight gain or weight loss Allergy and Immunology ROS: negative for - hives  Hematological and Lymphatic ROS: negative for - bleeding problems or bruising, negative for palpable nodes Endocrine ROS: negative for - heat or cold intolerance, hair changes Respiratory ROS: negative for - cough, shortness of breath or wheezing Cardiovascular ROS: no chest pain or palpitations GI ROS: negative for nausea, vomiting, abdominal pain, diarrhea, constipation Musculoskeletal ROS: negative for - joint swelling or muscle pain Neurological ROS: negative for - confusion,  syncope Dermatological ROS: negative for pruritus and rash Psychiatric: negative for anxiety, depression, difficulty sleeping and memory loss  MEDICATIONS: Current Medications        Current Outpatient Medications  Medication Sig Dispense Refill  . AMIOdarone (PACERONE) 200 MG tablet Take 1 tablet by mouth once daily    . amLODIPine (NORVASC) 10 MG tablet TAKE ONE-HALF TABLET BY MOUTH EVERY DAY FOR BLOOD PRESSURE    . apixaban (ELIQUIS) 2.5 mg tablet Take 1 tablet by mouth 2 (two) times daily    . empagliflozin (JARDIANCE) 25 mg tablet TAKE ONE-HALF TABLET BY MOUTH EVERY DAY FOR DIABETES    . ferrous sulfate 325 (65 FE) MG tablet TAKE ONE TABLET BY MOUTH EVERY OTHER DAY FOR IRON REPLACEMENT    . finasteride (PROSCAR) 5 mg tablet Take 1 tablet by mouth once daily    . FUROsemide (LASIX) 40 MG tablet Take 1 tablet by mouth once daily    . lisinopriL (ZESTRIL) 10 MG tablet TAKE ONE-HALF TABLET BY MOUTH DAILY FBP FOR BLOOD PRESSURE    . metoprolol tartrate (LOPRESSOR) 100 MG tablet Take 25 mg by mouth 2 (two) times daily       . simvastatin (ZOCOR) 20 MG tablet Take 1 tablet by mouth once daily    . terazosin (HYTRIN) 2 MG capsule Take 1 capsule by mouth once daily     No current facility-administered medications for this visit.      ALLERGIES: Pioglitazone  PAST MEDICAL HISTORY:     Past Medical History:  Diagnosis Date  . Chronic kidney disease   . Diabetes mellitus without complication (CMS-HCC)   . Hypertension  PAST SURGICAL HISTORY: History reviewed. No pertinent surgical history.   FAMILY HISTORY: History reviewed. No pertinent family history.   SOCIAL HISTORY: Social History          Socioeconomic History  . Marital status: Married    Spouse name: Not on file  . Number of children: Not on file  . Years of education: Not on file  . Highest education level: Not on file  Occupational History  . Not on file  Tobacco Use   . Smoking status: Former Research scientist (life sciences)  . Smokeless tobacco: Never Used  Substance and Sexual Activity  . Alcohol use: Never  . Drug use: Never  . Sexual activity: Never  Other Topics Concern  . Not on file  Social History Narrative  . Not on file   Social Determinants of Health      Financial Resource Strain:   . Difficulty of Paying Living Expenses:   Food Insecurity:   . Worried About Charity fundraiser in the Last Year:   . Arboriculturist in the Last Year:   Transportation Needs:   . Film/video editor (Medical):   Marland Kitchen Lack of Transportation (Non-Medical):       PHYSICAL EXAM:    Vitals:   11/26/19 1108  BP: 133/68  Pulse: 53   Body mass index is 22.32 kg/m. Weight: 72.6 kg (160 lb)   GENERAL: Alert, active, oriented x3  HEENT: Pupils equal reactive to light. Extraocular movements are intact. Sclera clear. Palpebral conjunctiva normal red color.Pharynx clear.  NECK: Supple with no palpable mass and no adenopathy.  LUNGS: Sound clear with no rales rhonchi or wheezes.  HEART: Regular rhythm S1 and S2 without murmur.  ABDOMEN: Soft and depressible, nontender with no palpable mass, no hepatomegaly.   EXTREMITIES: Well-developed well-nourished symmetrical with no dependent edema.  NEUROLOGICAL: Awake alert oriented, facial expression symmetrical, moving all extremities.  REVIEW OF DATA: I have reviewed the following data today:      Office Visit on 11/26/2019  Component Date Value  . WBC (White Blood Cell Co* 11/26/2019 8.2   . RBC (Red Blood Cell Coun* 11/26/2019 4.53*  . Hemoglobin 11/26/2019 13.7*  . Hematocrit 11/26/2019 43.2   . MCV (Mean Corpuscular Vo* 11/26/2019 95.4   . MCH (Mean Corpuscular He* 11/26/2019 30.2   . MCHC (Mean Corpuscular H* 11/26/2019 31.7*  . Platelet Count 11/26/2019 218   . RDW-CV (Red Cell Distrib* 11/26/2019 12.9   . MPV (Mean Platelet Volum* 11/26/2019 10.4   . Neutrophils 11/26/2019 5.71   . Lymphocytes  11/26/2019 1.60   . Monocytes 11/26/2019 0.67   . Eosinophils 11/26/2019 0.06   . Basophils 11/26/2019 0.10*  . Neutrophil % 11/26/2019 69.9   . Lymphocyte % 11/26/2019 19.6   . Monocyte % 11/26/2019 8.2   . Eosinophil % 11/26/2019 0.7*  . Basophil% 11/26/2019 1.2   . Immature Granulocyte % 11/26/2019 0.4   . Immature Granulocyte Cou* 11/26/2019 0.03   . Glucose 11/26/2019 247*  . Sodium 11/26/2019 137   . Potassium 11/26/2019 4.9   . Chloride 11/26/2019 103   . Carbon Dioxide (CO2) 11/26/2019 28.2   . Urea Nitrogen (BUN) 11/26/2019 44*  . Creatinine 11/26/2019 2.0*  . Glomerular Filtration Ra* 11/26/2019 32*  . Calcium 11/26/2019 9.7   . AST  11/26/2019 20   . ALT  11/26/2019 23   . Alk Phos (alkaline Phosp* 11/26/2019 105*  . Albumin 11/26/2019 4.0   . Bilirubin, Total 11/26/2019 0.7   .  Protein, Total 11/26/2019 6.9   . A/G Ratio 11/26/2019 1.4      ASSESSMENT: Mr. Dunnigan is a 85 y.o. male presenting for consultation for cholelithiasis / biliary dyskinesia.    Patient was oriented about the diagnosis of cholelithiasis. Also oriented about what is the gallbladder, its anatomy and function and the implications of having stones. The patient was oriented about the treatment alternatives (observation vs cholecystectomy). Patient was oriented that a low percentage of patient will continue to have similar pain symptoms even after the gallbladder is removed. Surgical technique (open vs laparoscopic) was discussed. It was also discussed the goals of the surgery (decrease the pain episodes and avoid the risk of cholecystitis) and the risk of surgery including: bleeding, infection, common bile duct injury, stone retention, injury to other organs such as bowel, liver, stomach, other complications such as hernia, bowel obstruction among others. Also discussed with patient about anesthesia and its complications such as: reaction to medications, pneumonia, heart complications, death, among  others.  Due to recent history of choledocholithiasis I consider that it is recommended to proceed with cholecystectomy to avoid future recurrence of choledocholithiasis and its complications such as cholangitis. Also will decrease the risk of urgent/emergent surgery if patient comes with cholecystitis.   Will ask for cardiac clearance to coordinate for surgery.   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 Robotic assisted laparoscopic cholecystectomy (61950) 2.  CBC, CMP 3.  Cardiology clearance (Dr. Ubaldo Glassing) 4.  Will need to hold Eliquis 48 hours before the surgery  5.  Contact us if has any question or concern.  Patient and his wife verbalized understanding, all questions were answered, and were agreeable with the plan outlined above.     Herbert Pun, MD

## 2019-12-11 ENCOUNTER — Other Ambulatory Visit
Admission: RE | Admit: 2019-12-11 | Discharge: 2019-12-11 | Disposition: A | Discharge status: Home / Self Care | Payer: Medicare Other | Source: Ambulatory Visit | Attending: General Surgery | Admitting: General Surgery

## 2019-12-11 ENCOUNTER — Other Ambulatory Visit: Payer: Self-pay

## 2019-12-11 HISTORY — DX: Pleural effusion, not elsewhere classified: J90

## 2019-12-11 HISTORY — DX: Benign prostatic hyperplasia without lower urinary tract symptoms: N40.0

## 2019-12-11 HISTORY — DX: Unspecified atrial fibrillation: I48.91

## 2019-12-11 NOTE — Pre-Procedure Instructions (Signed)
Multiple attempts to contact patient this morning and this afternoon at both [phone] and [phone]. Call back numbers given. Dr Will Bonnet office notified and also attempted to contact pt thru the above numbers. Call back number left on [phone].

## 2019-12-11 NOTE — Patient Instructions (Signed)
Your procedure is scheduled on: December 18, 2019 [facility] Report to Day Surgery on the 2nd floor of the Medical Mall. To find out your arrival time, please call [phone] between 1PM - 3PM on: December 17, 2019  REMEMBER: Instructions that are not followed completely may result in serious medical risk, up to and including death; or upon the discretion of your surgeon and anesthesiologist your surgery may need to be rescheduled.  Do not eat food after midnight the night before surgery.  No gum chewing, lozengers or hard candies.  You may however, drink CLEAR liquids up to 2 hours before you are scheduled to arrive for your surgery. Do not drink anything within 2 hours of your scheduled arrival time.  Clear liquids include: - water   Do NOT drink anything that is not on this list.  Type 1 and Type 2 diabetics should only drink water.   TAKE THESE MEDICATIONS THE MORNING OF SURGERY WITH A SIP OF WATER: AMIODARONE FINASTERIDE METOPROLOL  LAST DOSE OF ELIQUIS December 15, 2019 SUNDAY  Follow recommendations from Cardiologist, Pulmonologist or PCP regarding stopping Aspirin, Coumadin, Plavix, Eliquis, Pradaxa, or Pletal.  One week prior to surgery: Stop Anti-inflammatories (NSAIDS) such as Advil, Aleve, Ibuprofen, Motrin, Naproxen, Naprosyn and Aspirin based products such as Excedrin, Goodys Powder, BC Powder. Stop ANY OVER THE COUNTER supplements until after surgery. (You may continue taking Tylenol, Vitamin D, Vitamin B, and multivitamin.)  No Alcohol for 24 hours before or after surgery.  No Smoking including e-cigarettes for 24 hours prior to surgery.  No chewable tobacco products for at least 6 hours prior to surgery.  No nicotine patches on the day of surgery.  Do not use any "recreational" drugs for at least a week prior to your surgery.  Please be advised that the combination of cocaine and anesthesia may have negative outcomes, up to and including death. If you  test positive for cocaine, your surgery will be cancelled.  On the morning of surgery brush your teeth with toothpaste and water, you may rinse your mouth with mouthwash if you wish. Do not swallow any toothpaste or mouthwash.  Do not wear jewelry, make-up, hairpins, clips or nail polish.  Do not wear lotions, powders, or perfumes AFTERSHAVE OR DEODORANT   Do not shave 48 hours prior to surgery.   Contact lenses, hearing aids and dentures may not be worn into surgery.  Do not bring valuables to the hospital. [facility] is not responsible for any missing/lost belongings or valuables.   Use CHG Soap as directed on instruction sheet.  Notify your doctor if there is any change in your medical condition (cold, fever, infection).  Wear comfortable clothing (specific to your surgery type) to the hospital.  Plan for stool softeners for home use; pain medications have a tendency to cause constipation. You can also help prevent constipation by eating foods high in fiber such as fruits and vegetables and drinking plenty of fluids as your diet allows.  After surgery, you can help prevent lung complications by doing breathing exercises.  Take deep breaths and cough every 1-2 hours. Your doctor may order a device called an Incentive Spirometer to help you take deep breaths. When coughing or sneezing, hold a pillow firmly against your incision with both hands. This is called "splinting." Doing this helps protect your incision. It also decreases belly discomfort.   If you are being discharged the day of surgery, you will not be allowed to drive home. You will need  a responsible adult (18 years or older) to drive you home and stay with you that night.   Please call the Pre-admissions Testing Dept. at [phone] if you have any questions about these instructions.  Visitation Policy:  Patients undergoing a surgery or procedure may have one family member or support person with them as long as  that person is not COVID-19 positive or experiencing its symptoms.  That person may remain in the waiting area during the procedure.  Inpatient Visitation Update:   In an effort to ensure the safety of our team members and our patients, we are implementing a change to our visitation policy:  Effective Monday, Aug. 9, at 7 a.m., inpatients will be allowed one support person.  o The support person may change daily.  o The support person must pass our screening, gel in and out, and wear a mask at all times, including in the patient's room.  o Patients must also wear a mask when staff or their support person are in the room.  o Masking is required regardless of vaccination status.  Systemwide, no visitors 17 or younger.

## 2019-12-12 NOTE — Progress Notes (Addendum)
[facility] Perioperative Services  Pre-Admission/Anesthesia Testing Clinical Review  Date: 12/13/19  Patient Demographics:  Name: Andrew Watts DOB:   1934/10/29 MRN:   [id number]  Planned Surgical Procedure(s):    Case: [id number] Date/Time: 12/18/19 0715   Procedure: XI ROBOTIC ASSISTED LAPAROSCOPIC CHOLECYSTECTOMY (N/A Abdomen)   Anesthesia type: General   Pre-op diagnosis: K80.10 CCC   Location: [facility] OR ROOM 06 / [facility] ORS FOR ANESTHESIA GROUP   Surgeons: Herbert Pun, MD     NOTE: Available PAT nursing documentation and vital signs have been reviewed. Clinical nursing staff has updated patient's PMH/PSHx, current medication list, and drug allergies/intolerances to ensure comprehensive history available to assist in medical decision making as it pertains to the aforementioned surgical procedure and anticipated anesthetic course.   Clinical Discussion:  Andrew Watts is a 85 y.o. male who is submitted for pre-surgical anesthesia review and clearance prior to him undergoing the above procedure. Patient is a Former Smoker (quit 03/1985). Pertinent PMH includes: A. fib, MI x 2 (1986), CHF, HTN, HLD, T2DM, thrombocytopenia.  Patient is followed by cardiology Ubaldo Glassing, MD). He was last seen in the cardiology clinic on 09/25/2019; notes reviewed.  At the time of his clinic visit, patient was doing "great" from a cardiac standpoint.  Patient reported that he felt better than he had in quite some time.  Patient denied any angina, increased shortness of breath, palpitations, vertiginous symptoms, peripheral edema, PND, orthopnea, or presyncope/syncope.  Last TTE revealed an LVEF of 35-40%.  Patient seen in follow-up after and admission to [facility] from 11/05/19 through 11/10/2019 for abdominal pain, shortness of breath, and A. fib with RVR. During his admission, patient found to have bilateral pleural effusions.  Patient underwent thoracentesis whereby  700 cc of fluid was  removed from the RIGHT lung; LEFT effusion was managed medically.  Patient was discharged home on amiodarone and anticoagulated with renal dose apixaban twice daily.  Patient on beta-blocker twice daily and daily statin.  Patient has some residual shortness of breath, however notes her breathing has markedly improved since being in the hospital and is felt to be at baseline.  Patient presented to cardiology clinic and rate controlled A. fib.  Repeat CXR revealed no recurrent pleural effusions.  Patient scheduled to follow-up in routine consult in 6 months.  Patient scheduled to undergo robot-assisted laparoscopic cholecystectomy on 12/18/2019 with Dr. Windell Moment.  Given patient's past medical history significant for cardiac issues, presurgical cardiac clearance was sought.  Per cardiology, patient may proceed with procedure with a LOW to MODERATE risk stratification and no further cardiovascular testing. He has been instructed on recommendations for holding his apixaban for 3 days prior to his procedure. The patient has been instructed that his last dose of his anticoagulant will be on 12/14/2019.  He denies previous perioperative complications with anesthesia. He underwent a MAC anesthetic course here (ASA III) in 03/2016 with no documented complications.   Vitals with BMI 12/11/2019 11/10/2019 11/09/2019  Height _0  - -  Weight 150 lbs - -  BMI 93.26 - -  Systolic - 712 458  Diastolic - 59 74  Pulse - 59 66    Providers/Specialists:   NOTE: Primary physician provider listed below. Patient may have been seen by APP or partner within same practice.   PROVIDER ROLE LAST Tanna Savoy, MD General Surgery 11/26/2019  Lucille Passy, [place] Primary Care Provider ?  Bartholome Bill, MD Cardiology 09/25/2019   Allergies:  Pioglitazone  Current Home  Medications:   No current facility-administered medications for this encounter.   Marland Kitchen amiodarone (PACERONE) 200 MG tablet  . apixaban  (ELIQUIS) 2.5 MG TABS tablet  . empagliflozin (JARDIANCE) 25 MG TABS tablet  . ferrous sulfate 325 (65 FE) MG tablet  . finasteride (PROSCAR) 5 MG tablet  . furosemide (LASIX) 40 MG tablet  . metoprolol tartrate (LOPRESSOR) 25 MG tablet  . Multiple Vitamins-Minerals (PRESERVISION AREDS 2 PO)  . Nutritional Supplements (FEEDING SUPPLEMENT, NEPRO CARB STEADY,) LIQD  . simvastatin (ZOCOR) 20 MG tablet  . terazosin (HYTRIN) 2 MG capsule   History:   Past Medical History:  Diagnosis Date  . A-fib ([place])   . Benign prostatic hyperplasia   . CHF (congestive heart failure) ([place])   . Chronic kidney disease    told 7-8 yrs ago. kidney function only 35%  . Diabetes mellitus without complication ([place])   . Hypercholesteremia   . Hypertension   . Myocardial infarction ([place]) 1986   x2  . Pleural effusion    BILATERAL  . Wears dentures    full upper and lower   Past Surgical History:  Procedure Laterality Date  . CARDIAC CATHETERIZATION  1986   stent placed  . CATARACT EXTRACTION W/PHACO Right 02/23/2016   Procedure: CATARACT EXTRACTION PHACO AND INTRAOCULAR LENS PLACEMENT (IOC);  Surgeon: Eulogio Bear, MD;  Location: [place];  Service: Ophthalmology;  Laterality: Right;  RIGHT DIABETIC - oral meds  . CATARACT EXTRACTION W/PHACO Left 04/05/2016   Procedure: CATARACT EXTRACTION PHACO AND INTRAOCULAR LENS PLACEMENT (IOC);  Surgeon: Eulogio Bear, MD;  Location: [facility];  Service: Ophthalmology;  Laterality: Left;  LEFT DIABETES - oral meds IVA TOPICAL  . TOE SURGERY Right    [facility]E   No family history on file. Social History   Tobacco Use  . Smoking status: Former Smoker    Quit date: 03/28/1985    Years since quitting: 34.7  . Smokeless tobacco: Never Used  Vaping Use  . Vaping Use: Never used  Substance Use Topics  . Alcohol use: No  . Drug use: Never    Pertinent Clinical Results:  LABS:       ECG: Date: 11/05/2019 Time ECG  obtained: 0143 AM Rate: 60 bpm Rhythm:  Sinus rhythm with occasional PVCs and PACs Axis (leads I and aVF): Normal Intervals: PR 174 ms. QRS 108 ms. QTc 452 ms. ST segment and T wave changes: No evidence of acute ST segment elevation or depression Comparison: Similar to previous tracing obtained on 05/18/2019.   IMAGING / PROCEDURES: ECHOCARDIOGRAM done on 05/15/2019 1. Left ventricular ejection fraction, by estimation, is 35 to 40%.  2. The left ventricle has normal function.  3. The left ventricle has no regional wall motion abnormalities.  4. The left ventricular internal cavity size was mildly dilated.  5. Left ventricular diastolic parameters were normal.  6. Right ventricular systolic function is normal.  7. The right ventricular size is mildly enlarged.  8. Left atrial size was mildly dilated.  9. Right atrial size was mildly dilated.  10. The mitral valve is degenerative. Trivial mitral valve regurgitation.  11. The aortic valve is tricuspid. Aortic valve regurgitation is not visualized.   MR ABDOMEN MRCP W/WO CONTRAST done on 11/05/2019 1. Gallbladder distension with diffuse, irregular gallbladder wall thickening and pericholecystic edema. Although no gallstones are evident by MRI, several were identified on recent ultrasound earlier today. 2. No intra or extrahepatic biliary duct dilatation. No evidence  of choledocholithiasis. Of note, if the gallstones seen on ultrasound are in apparent by MRI, ductal stones may also be occult. 3. 2 tiny cystic lesions in the pancreas measuring 4 and 8 mm. These are likely benign. Consensus guidelines suggest that for cystic lesions of this size in a patient of this age, repeat imaging in 2 years is warranted.   Impression and Plan:  Andrew Watts has been referred for pre-anesthesia review and clearance prior to him undergoing the planned anesthetic and procedural courses. Available labs, pertinent testing, and imaging results were  personally reviewed by me. This patient has been appropriately cleared by cardiology.   Based on clinical review performed today (12/13/19), barring any significant acute changes in the patient's overall condition, it is anticipated that he will be able to proceed with the planned surgical intervention. Any acute changes in clinical condition may necessitate his procedure being postponed and/or cancelled. Pre-surgical instructions were reviewed with the patient during his PAT appointment and questions were fielded by PAT clinical staff.  Honor Loh, MSN, APRN, FNP-C, CEN [facility]  Peri-operative Services Nurse Practitioner Phone: [phone] 12/13/19 2:10 PM  NOTE: This note has been prepared using Dragon dictation software. Despite my best ability to proofread, there is always the potential that unintentional transcriptional errors may still occur from this process.

## 2019-12-16 ENCOUNTER — Other Ambulatory Visit
Admission: RE | Admit: 2019-12-16 | Discharge: 2019-12-16 | Disposition: A | Discharge status: Home / Self Care | Payer: Medicare Other | Source: Ambulatory Visit | Attending: General Surgery | Admitting: General Surgery

## 2019-12-16 ENCOUNTER — Other Ambulatory Visit: Payer: Self-pay

## 2019-12-16 DIAGNOSIS — Z01812 Encounter for preprocedural laboratory examination: Secondary | ICD-10-CM | POA: Diagnosis present

## 2019-12-16 DIAGNOSIS — Z20822 Contact with and (suspected) exposure to covid-19: Secondary | ICD-10-CM | POA: Insufficient documentation

## 2019-12-16 LAB — SARS CORONAVIRUS 2 (TAT 6-24 HRS): SARS Coronavirus 2: NEGATIVE

## 2019-12-18 ENCOUNTER — Other Ambulatory Visit: Payer: Self-pay

## 2019-12-18 ENCOUNTER — Ambulatory Visit
Admission: RE | Admit: 2019-12-18 | Discharge: 2019-12-18 | Disposition: A | Discharge status: Home / Self Care | Payer: Medicare Other | Attending: General Surgery | Admitting: General Surgery

## 2019-12-18 ENCOUNTER — Encounter: Payer: Self-pay | Admitting: General Surgery

## 2019-12-18 ENCOUNTER — Encounter
Admission: RE | Disposition: A | Discharge status: Home / Self Care | Payer: Self-pay | Source: Home / Self Care | Attending: General Surgery

## 2019-12-18 ENCOUNTER — Ambulatory Visit: Payer: Medicare Other | Admitting: Urgent Care

## 2019-12-18 DIAGNOSIS — N1832 Chronic kidney disease, stage 3b: Secondary | ICD-10-CM | POA: Insufficient documentation

## 2019-12-18 DIAGNOSIS — I509 Heart failure, unspecified: Secondary | ICD-10-CM | POA: Insufficient documentation

## 2019-12-18 DIAGNOSIS — N400 Enlarged prostate without lower urinary tract symptoms: Secondary | ICD-10-CM | POA: Insufficient documentation

## 2019-12-18 DIAGNOSIS — D763 Other histiocytosis syndromes: Secondary | ICD-10-CM | POA: Insufficient documentation

## 2019-12-18 DIAGNOSIS — I130 Hypertensive heart and chronic kidney disease with heart failure and stage 1 through stage 4 chronic kidney disease, or unspecified chronic kidney disease: Secondary | ICD-10-CM | POA: Insufficient documentation

## 2019-12-18 DIAGNOSIS — Z8616 Personal history of COVID-19: Secondary | ICD-10-CM | POA: Insufficient documentation

## 2019-12-18 DIAGNOSIS — Z7901 Long term (current) use of anticoagulants: Secondary | ICD-10-CM | POA: Insufficient documentation

## 2019-12-18 DIAGNOSIS — Z79899 Other long term (current) drug therapy: Secondary | ICD-10-CM | POA: Insufficient documentation

## 2019-12-18 DIAGNOSIS — E785 Hyperlipidemia, unspecified: Secondary | ICD-10-CM | POA: Diagnosis not present

## 2019-12-18 DIAGNOSIS — Z87891 Personal history of nicotine dependence: Secondary | ICD-10-CM | POA: Insufficient documentation

## 2019-12-18 DIAGNOSIS — K811 Chronic cholecystitis: Secondary | ICD-10-CM | POA: Insufficient documentation

## 2019-12-18 DIAGNOSIS — E1122 Type 2 diabetes mellitus with diabetic chronic kidney disease: Secondary | ICD-10-CM | POA: Insufficient documentation

## 2019-12-18 DIAGNOSIS — I4891 Unspecified atrial fibrillation: Secondary | ICD-10-CM | POA: Diagnosis not present

## 2019-12-18 LAB — GLUCOSE, CAPILLARY
Glucose-Capillary: 302 mg/dL — ABNORMAL HIGH (ref 70–99)
Glucose-Capillary: 237 mg/dL — ABNORMAL HIGH (ref 70–99)

## 2019-12-18 SURGERY — CHOLECYSTECTOMY, ROBOT-ASSISTED, LAPAROSCOPIC
Anesthesia: General | Site: Abdomen

## 2019-12-18 MED ORDER — FENTANYL CITRATE (PF) 100 MCG/2ML IJ SOLN
INTRAMUSCULAR | Status: AC
  Filled 2019-12-18: qty 6

## 2019-12-18 MED ORDER — ONDANSETRON HCL 4 MG/2ML IJ SOLN
INTRAMUSCULAR | Status: DC | PRN
  Administered 2019-12-18: 4 mg via INTRAVENOUS

## 2019-12-18 MED ORDER — INSULIN ASPART 100 UNIT/ML ~~LOC~~ SOLN: 7.0000 [IU] | Freq: Once | SUBCUTANEOUS | Status: AC

## 2019-12-18 MED ORDER — CEFAZOLIN SODIUM-DEXTROSE 2-4 GM/100ML-% IV SOLN
2.0000 g | INTRAVENOUS | Status: AC
  Administered 2019-12-18: 2 g via INTRAVENOUS

## 2019-12-18 MED ORDER — FENTANYL CITRATE (PF) 100 MCG/2ML IJ SOLN
INTRAMUSCULAR | Status: DC | PRN
Start: 2019-12-18 — End: 2019-12-18
  Administered 2019-12-18: 25 ug via INTRAVENOUS
  Administered 2019-12-18 (×2): 50 ug via INTRAVENOUS
  Administered 2019-12-18: 25 ug via INTRAVENOUS
  Administered 2019-12-18: 50 ug via INTRAVENOUS

## 2019-12-18 MED ORDER — SUGAMMADEX SODIUM 200 MG/2ML IV SOLN
INTRAVENOUS | Status: DC | PRN
  Administered 2019-12-18: 200 mg via INTRAVENOUS

## 2019-12-18 MED ORDER — SODIUM CHLORIDE 0.9 % IV SOLN: INTRAVENOUS | Status: DC

## 2019-12-18 MED ORDER — ROCURONIUM BROMIDE 100 MG/10ML IV SOLN
INTRAVENOUS | Status: DC | PRN
  Administered 2019-12-18: 50 mg via INTRAVENOUS
  Administered 2019-12-18: 20 mg via INTRAVENOUS

## 2019-12-18 MED ORDER — ROCURONIUM BROMIDE 10 MG/ML (PF) SYRINGE
PREFILLED_SYRINGE | INTRAVENOUS | Status: AC
  Filled 2019-12-18: qty 10

## 2019-12-18 MED ORDER — PROPOFOL 10 MG/ML IV BOLUS
INTRAVENOUS | Status: AC
  Filled 2019-12-18: qty 20

## 2019-12-18 MED ORDER — LIDOCAINE HCL (PF) 2 % IJ SOLN
INTRAMUSCULAR | Status: AC
  Filled 2019-12-18: qty 5

## 2019-12-18 MED ORDER — GLYCOPYRROLATE 0.2 MG/ML IJ SOLN
INTRAMUSCULAR | Status: AC
  Filled 2019-12-18: qty 1

## 2019-12-18 MED ORDER — PHENYLEPHRINE HCL-NACL 10-0.9 MG/250ML-% IV SOLN
INTRAVENOUS | Status: DC | PRN
  Administered 2019-12-18: 80 ug/min via INTRAVENOUS

## 2019-12-18 MED ORDER — INSULIN ASPART 100 UNIT/ML ~~LOC~~ SOLN
SUBCUTANEOUS | Status: AC
  Administered 2019-12-18: 7 [IU] via SUBCUTANEOUS
  Filled 2019-12-18: qty 1

## 2019-12-18 MED ORDER — EPHEDRINE 5 MG/ML INJ
INTRAVENOUS | Status: AC
  Filled 2019-12-18: qty 10

## 2019-12-18 MED ORDER — FAMOTIDINE 20 MG PO TABS
ORAL_TABLET | ORAL | Status: AC
  Filled 2019-12-18: qty 1

## 2019-12-18 MED ORDER — HYDROCODONE-ACETAMINOPHEN 5-325 MG PO TABS
1.0000 | ORAL_TABLET | ORAL | 0 refills | Status: AC | PRN
Start: 2019-12-18 — End: 2019-12-21

## 2019-12-18 MED ORDER — LIDOCAINE HCL (CARDIAC) PF 100 MG/5ML IV SOSY
PREFILLED_SYRINGE | INTRAVENOUS | Status: DC | PRN
  Administered 2019-12-18: 80 mg via INTRAVENOUS

## 2019-12-18 MED ORDER — PROPOFOL 10 MG/ML IV BOLUS
INTRAVENOUS | Status: DC | PRN
  Administered 2019-12-18: 100 mg via INTRAVENOUS

## 2019-12-18 MED ORDER — INDOCYANINE GREEN 25 MG IV SOLR
1.2500 mg | Freq: Once | INTRAVENOUS | Status: AC
  Administered 2019-12-18: 1.25 mg via INTRAVENOUS
  Filled 2019-12-18: qty 0.5

## 2019-12-18 MED ORDER — DEXAMETHASONE SODIUM PHOSPHATE 10 MG/ML IJ SOLN
INTRAMUSCULAR | Status: DC | PRN
  Administered 2019-12-18: 5 mg via INTRAVENOUS

## 2019-12-18 MED ORDER — ORAL CARE MOUTH RINSE: 15.0000 mL | Freq: Once | OROMUCOSAL | Status: AC

## 2019-12-18 MED ORDER — BUPIVACAINE-EPINEPHRINE 0.25% -1:200000 IJ SOLN
INTRAMUSCULAR | Status: DC | PRN
  Administered 2019-12-18: 10 mL
  Administered 2019-12-18: 20 mL

## 2019-12-18 MED ORDER — "VISTASEAL 4 ML SINGLE DOSE KIT "
PACK | CUTANEOUS | Status: DC | PRN
  Administered 2019-12-18: 4 mL via TOPICAL

## 2019-12-18 MED ORDER — EPHEDRINE SULFATE 50 MG/ML IJ SOLN
INTRAMUSCULAR | Status: DC | PRN
  Administered 2019-12-18 (×2): 5 mg via INTRAVENOUS

## 2019-12-18 MED ORDER — SODIUM CHLORIDE 0.9 % IV SOLN: INTRAVENOUS | Status: DC | PRN

## 2019-12-18 MED ORDER — BUPIVACAINE-EPINEPHRINE (PF) 0.25% -1:200000 IJ SOLN
INTRAMUSCULAR | Status: AC
  Filled 2019-12-18: qty 30

## 2019-12-18 MED ORDER — CEFAZOLIN SODIUM-DEXTROSE 2-4 GM/100ML-% IV SOLN
INTRAVENOUS | Status: AC
  Filled 2019-12-18: qty 100

## 2019-12-18 MED ORDER — GLYCOPYRROLATE 0.2 MG/ML IJ SOLN
INTRAMUSCULAR | Status: DC | PRN
  Administered 2019-12-18: .2 mg via INTRAVENOUS

## 2019-12-18 MED ORDER — PHENYLEPHRINE HCL (PRESSORS) 10 MG/ML IV SOLN
INTRAVENOUS | Status: DC | PRN
  Administered 2019-12-18 (×2): 100 ug via INTRAVENOUS

## 2019-12-18 MED ORDER — FAMOTIDINE 20 MG PO TABS
20.0000 mg | ORAL_TABLET | Freq: Once | ORAL | Status: AC
  Administered 2019-12-18: 20 mg via ORAL

## 2019-12-18 MED ORDER — CHLORHEXIDINE GLUCONATE 0.12 % MT SOLN: 15.0000 mL | Freq: Once | OROMUCOSAL | Status: AC

## 2019-12-18 MED ORDER — CHLORHEXIDINE GLUCONATE 0.12 % MT SOLN
OROMUCOSAL | Status: AC
  Administered 2019-12-18: 15 mL via OROMUCOSAL
  Filled 2019-12-18: qty 15

## 2019-12-18 SURGICAL SUPPLY — 59 items
ADH SKN CLS APL DERMABOND .7 (GAUZE/BANDAGES/DRESSINGS) ×1
APL LAPSCP 45 DL APL FL B (MISCELLANEOUS) ×1
APL PRP STRL LF DISP 70% ISPRP (MISCELLANEOUS) ×1
APPLICATOR VISTASEAL FLEXIBLE (MISCELLANEOUS) ×2 IMPLANT
BAG INFUSER PRESSURE 100CC (MISCELLANEOUS) ×2 IMPLANT
BAG SPEC RTRVL LRG 6X4 10 (ENDOMECHANICALS) ×1
BLADE SURG SZ11 CARB STEEL (BLADE) ×3 IMPLANT
CANISTER SUCT 1200ML W/VALVE (MISCELLANEOUS) ×3 IMPLANT
CANNULA REDUC XI 12-8 STAPL (CANNULA) ×1
CANNULA REDUC XI 12-8MM STAPL (CANNULA) ×1
CANNULA REDUCER 12-8 DVNC XI (CANNULA) ×1 IMPLANT
CHLORAPREP W/TINT 26 (MISCELLANEOUS) ×3 IMPLANT
CLIP VESOLOCK MED LG 6/CT (CLIP) ×3 IMPLANT
COVER WAND RF STERILE (DRAPES) ×3 IMPLANT
DECANTER SPIKE VIAL GLASS SM (MISCELLANEOUS) ×6 IMPLANT
DEFOGGER SCOPE WARMER CLEARIFY (MISCELLANEOUS) ×3 IMPLANT
DERMABOND ADVANCED (GAUZE/BANDAGES/DRESSINGS) ×2
DERMABOND ADVANCED .7 DNX12 (GAUZE/BANDAGES/DRESSINGS) ×1 IMPLANT
DRAPE ARM DVNC X/XI (DISPOSABLE) ×4 IMPLANT
DRAPE COLUMN DVNC XI (DISPOSABLE) ×1 IMPLANT
DRAPE DA VINCI XI ARM (DISPOSABLE) ×8
DRAPE DA VINCI XI COLUMN (DISPOSABLE) ×2
ELECT REM PT RETURN 9FT ADLT (ELECTROSURGICAL) ×3
ELECTRODE REM PT RTRN 9FT ADLT (ELECTROSURGICAL) ×1 IMPLANT
GLOVE BIO SURGEON STRL SZ 6.5 (GLOVE) ×4 IMPLANT
GLOVE BIO SURGEONS STRL SZ 6.5 (GLOVE) ×2
GLOVE BIOGEL PI IND STRL 6.5 (GLOVE) ×2 IMPLANT
GLOVE BIOGEL PI INDICATOR 6.5 (GLOVE) ×4
GOWN STRL REUS W/ TWL LRG LVL3 (GOWN DISPOSABLE) ×3 IMPLANT
GOWN STRL REUS W/TWL LRG LVL3 (GOWN DISPOSABLE) ×9
GRASPER SUT TROCAR 14GX15 (MISCELLANEOUS) ×2 IMPLANT
IRRIGATOR SUCT 8 DISP DVNC XI (IRRIGATION / IRRIGATOR) IMPLANT
IRRIGATOR SUCTION 8MM XI DISP (IRRIGATION / IRRIGATOR) ×2
IV NS 1000ML (IV SOLUTION) ×3
IV NS 1000ML BAXH (IV SOLUTION) IMPLANT
KIT PINK PAD W/HEAD ARE REST (MISCELLANEOUS) ×3
KIT PINK PAD W/HEAD ARM REST (MISCELLANEOUS) ×1 IMPLANT
LABEL OR SOLS (LABEL) ×3 IMPLANT
NDL INSUFFLATION 14GA 120MM (NEEDLE) ×1 IMPLANT
NEEDLE HYPO 22GX1.5 SAFETY (NEEDLE) ×3 IMPLANT
NEEDLE INSUFFLATION 14GA 120MM (NEEDLE) ×3 IMPLANT
NS IRRIG 500ML POUR BTL (IV SOLUTION) ×3 IMPLANT
OBTURATOR OPTICAL STANDARD 8MM (TROCAR) ×2
OBTURATOR OPTICAL STND 8 DVNC (TROCAR) ×1
OBTURATOR OPTICALSTD 8 DVNC (TROCAR) ×1 IMPLANT
PACK LAP CHOLECYSTECTOMY (MISCELLANEOUS) ×3 IMPLANT
POUCH SPECIMEN RETRIEVAL 10MM (ENDOMECHANICALS) ×3 IMPLANT
SEAL CANN UNIV 5-8 DVNC XI (MISCELLANEOUS) ×3 IMPLANT
SEAL XI 5MM-8MM UNIVERSAL (MISCELLANEOUS) ×6
SET TUBE SMOKE EVAC HIGH FLOW (TUBING) ×3 IMPLANT
SOLUTION ELECTROLUBE (MISCELLANEOUS) ×3 IMPLANT
STAPLER CANNULA SEAL DVNC XI (STAPLE) ×1 IMPLANT
STAPLER CANNULA SEAL XI (STAPLE) ×2
SUT MNCRL 4-0 (SUTURE) ×3
SUT MNCRL 4-0 27XMFL (SUTURE) ×1
SUT VIC AB 3-0 SH 27 (SUTURE)
SUT VIC AB 3-0 SH 27X BRD (SUTURE) IMPLANT
SUT VICRYL 0 AB UR-6 (SUTURE) IMPLANT
SUTURE MNCRL 4-0 27XMF (SUTURE) ×1 IMPLANT

## 2019-12-18 NOTE — Transfer of Care (Signed)
Immediate Anesthesia Transfer of Care Note  Patient: Andrew Watts  Procedure(s) Performed: XI ROBOTIC ASSISTED LAPAROSCOPIC CHOLECYSTECTOMY (N/A Abdomen)  Patient Location: PACU  Anesthesia Type:General  Level of Consciousness: awake, alert  and oriented  Airway & Oxygen Therapy: Patient Spontanous Breathing  Post-op Assessment: Report given to RN and Post -op Vital signs reviewed and stable  Post vital signs: Reviewed and stable  Last Vitals:  Vitals Value Taken Time  BP    Temp    Pulse    Resp    SpO2      Last Pain:  Vitals:   12/18/19 0616  TempSrc: Temporal  PainSc: 0-No pain         Complications: No complications documented.

## 2019-12-18 NOTE — Op Note (Signed)
Preoperative diagnosis: Chronic cholecystitis  Postoperative diagnosis: Chronic cholecystitis  Procedure: Robotic Assisted Laparoscopic Cholecystectomy.   Anesthesia: GETA   Surgeon: Dr. Hazle Quant  Wound Classification: Clean Contaminated  Indications: Patient is a 85 y.o. male developed right upper quadrant pain and elevated bilirubin.  MRCP negative for choledocholithiasis.  Due to patient multiple comorbidities he was treated initially with conservative management.  After stabilization of medical condition, robotic Assisted Laparoscopic cholecystectomy was elected.  Findings: Gallbladder full of purulent bile Critical view of safety achieved Cystic duct and artery identified, ligated and divided Adequate hemostasis      Description of procedure: The patient was placed on the operating table in the supine position. General anesthesia was induced. A time-out was completed verifying correct patient, procedure, site, positioning, and implant(s) and/or special equipment prior to beginning this procedure. An orogastric tube was placed. The abdomen was prepped and draped in the usual sterile fashion.  An incision was made in a natural skin line below the umbilicus.  The fascia was elevated and the Veress needle inserted. Proper position was confirmed by aspiration and saline meniscus test.  The abdomen was insufflated with carbon dioxide to a pressure of 15 mmHg. The patient tolerated insufflation well. A 8-mm trocar was then inserted in optiview fashion.  The laparoscope was inserted and the abdomen inspected. No injuries from initial trocar placement were noted. Additional trocars were then inserted in the following locations: an 8-mm trocar in the left lateral abdomen, and another two 8-mm trocars to the right side of the abdomen 5 cm appart. The umbilical trocar was changed to a 12 mm trocar all under direct visualization. The abdomen was inspected and no abnormalities were found. The  table was placed in the reverse Trendelenburg position with the right side up. The robotic arms were docked and target anatomy identified. Instrument inserted under direct visualization.  Filmy adhesions between the gallbladder and omentum, duodenum and transverse colon were lysed with electrocautery. The dome of the gallbladder was grasped with a prograsp and retracted over the dome of the liver. Very thick peritoneum was identified. Very difficult and time consuming dissection of the peritoneum needed to be done to be able to find the infundibulum.  The infundibulum was also grasped with an atraumatic grasper and retracted toward the right lower quadrant. This maneuver exposed Calot's triangle. The peritoneum overlying the gallbladder infundibulum was then incised and the cystic duct and cystic artery identified and circumferentially dissected. Upon dissection of the lateral aspect of the gallbladder the gallbladder wall was opened and purulent bile was drained. All the bile was able to be aspirated immediately. Critical view of safety reviewed before ligating any structure. Firefly images taken to visualize biliary ducts. The cystic duct and cystic artery were then doubly clipped and divided close to the gallbladder.  The gallbladder was then dissected from its peritoneal attachments by electrocautery. Hemostasis was checked and the gallbladder and contained stones were removed using an endoscopic retrieval bag. The gallbladder was passed off the table as a specimen. The gallbladder fossa was copiously irrigated with saline and hemostasis was obtained. There was no evidence of bleeding from the gallbladder fossa or cystic artery. Vistal seal was irrigated on the gallbladder fossa. Secondary trocars were removed under direct vision. No bleeding was noted. The robotic arms were undoked. The scope was withdrawn and the umbilical trocar removed. The abdomen was allowed to collapse. The fascia of the 40mm trocar  sites was closed with figure-of-eight 0 vicryl sutures. The skin  was closed with subcuticular sutures of 4-0 monocryl and topical skin adhesive. The orogastric tube was removed.  The patient tolerated the procedure well and was taken to the postanesthesia care unit in stable condition.   Specimen: Gallbladder  Complications: None  EBL: 10 mL

## 2019-12-18 NOTE — Discharge Instructions (Addendum)
AMBULATORY SURGERY  DISCHARGE INSTRUCTIONS   1) The drugs that you were given will stay in your system until tomorrow so for the next 24 hours you should not:  A) Drive an automobile B) Make any legal decisions C) Drink any alcoholic beverage   2) You may resume regular meals tomorrow.  Today it is better to start with liquids and gradually work up to solid foods.  You may eat anything you prefer, but it is better to start with liquids, then soup and crackers, and gradually work up to solid foods.   3) Please notify your doctor immediately if you have any unusual bleeding, trouble breathing, redness and pain at the surgery site, drainage, fever, or pain not relieved by medication.    4) Additional Instructions:        Please contact your physician with any problems or Same Day Surgery at [phone], Monday through Friday 6 am to 4 pm, or [place] [facility] [place] Main number at [phone]. Diet: Resume home heart healthy regular diet.   Activity: No heavy lifting >20 pounds (children, pets, laundry, garbage) or strenuous activity until follow-up, but light activity and walking are encouraged. Do not drive or drink alcohol if taking narcotic pain medications.  Wound care: May shower with soapy water and pat dry (do not rub incisions), but no baths or submerging incision underwater until follow-up. (no swimming)   Medications: Resume all home medications. For mild to moderate pain: acetaminophen (Tylenol) or ibuprofen (if no kidney disease). Combining Tylenol with alcohol can substantially increase your risk of causing liver disease. Narcotic pain medications, if prescribed, can be used for severe pain, though may cause nausea, constipation, and drowsiness. Do not combine Tylenol and Norco within a 6 hour period as Norco contains Tylenol. If you do not need the narcotic pain medication, you do not need to fill the prescription.  Call office ([phone]) at any time if any  questions, worsening pain, fevers/chills, bleeding, drainage from incision site, or other concerns.   AMBULATORY SURGERY  DISCHARGE INSTRUCTIONS   5) The drugs that you were given will stay in your system until tomorrow so for the next 24 hours you should not:  D) Drive an automobile E) Make any legal decisions F) Drink any alcoholic beverage   6) You may resume regular meals tomorrow.  Today it is better to start with liquids and gradually work up to solid foods.  You may eat anything you prefer, but it is better to start with liquids, then soup and crackers, and gradually work up to solid foods.   7) Please notify your doctor immediately if you have any unusual bleeding, trouble breathing, redness and pain at the surgery site, drainage, fever, or pain not relieved by medication.   8) Additional Instructions:   Please contact your physician with any problems or Same Day Surgery at [phone], Monday through Friday 6 am to 4 pm, or [place] [facility] [place] Main number at [phone].  

## 2019-12-18 NOTE — OR Nursing (Signed)
Discharge pending transportation 

## 2019-12-18 NOTE — Anesthesia Procedure Notes (Signed)
Procedure Name: Intubation Date/Time: 12/18/2019 7:33 AM Performed by: Armanda Heritage, CRNA Pre-anesthesia Checklist: Patient identified, Emergency Drugs available, Suction available, Patient being monitored and Timeout performed Patient Re-evaluated:Patient Re-evaluated prior to induction Oxygen Delivery Method: Circle system utilized Preoxygenation: Pre-oxygenation with 100% oxygen Induction Type: IV induction Ventilation: Mask ventilation without difficulty Laryngoscope Size: McGraph and 4 Grade View: Grade II Tube size: 7.0 mm Number of attempts: 2 Airway Equipment and Method: Stylet Placement Confirmation: ETT inserted through vocal cords under direct vision,  positive ETCO2 and breath sounds checked- equal and bilateral Secured at: 21 cm Dental Injury: Teeth and Oropharynx as per pre-operative assessment

## 2019-12-18 NOTE — Anesthesia Preprocedure Evaluation (Signed)
Anesthesia Evaluation  Patient identified by MRN, date of birth, ID band Patient awake    Reviewed: Allergy & Precautions, H&P , NPO status , Patient's Chart, lab work & pertinent test results  History of Anesthesia Complications Negative for: history of anesthetic complications  Airway Mallampati: III  TM Distance: <3 FB Neck ROM: limited    Dental  (+) Edentulous Upper, Edentulous Lower   Pulmonary neg pulmonary ROS, neg shortness of breath, former smoker,    Pulmonary exam normal        Cardiovascular Exercise Tolerance: Good hypertension, + Past MI, + Cardiac Stents and +CHF  Normal cardiovascular exam     Neuro/Psych negative neurological ROS  negative psych ROS   GI/Hepatic negative GI ROS, Neg liver ROS,   Endo/Other  diabetes, Type 2  Renal/GU Renal disease     Musculoskeletal   Abdominal   Peds  Hematology negative hematology ROS (+)   Anesthesia Other Findings Patient has cardiac clearance for this procedure.   Past Medical History: No date: A-fib [facility]) No date: Benign prostatic hyperplasia No date: CHF (congestive heart failure) (HCC) No date: Chronic kidney disease     Comment:  told 7-8 yrs ago. kidney function only 35% No date: Diabetes mellitus without complication (HCC) No date: Hypercholesteremia No date: Hypertension 1986: Myocardial infarction (HCC)     Comment:  x2 No date: Pleural effusion     Comment:  BILATERAL No date: Wears dentures     Comment:  full upper and lower  Past Surgical History: 1986: CARDIAC CATHETERIZATION     Comment:  stent placed 02/23/2016: CATARACT EXTRACTION W/PHACO; Right     Comment:  Procedure: CATARACT EXTRACTION PHACO AND INTRAOCULAR               LENS PLACEMENT (IOC);  Surgeon: Nevada Crane, MD;                Location: [facility];  Service: Ophthalmology;                Laterality: Right;  RIGHT DIABETIC - oral meds 04/05/2016:  CATARACT EXTRACTION W/PHACO; Left     Comment:  Procedure: CATARACT EXTRACTION PHACO AND INTRAOCULAR               LENS PLACEMENT (IOC);  Surgeon: Nevada Crane, MD;                Location: [facility];  Service: Ophthalmology;                Laterality: Left;  LEFT DIABETES - oral meds IVA               TOPICAL No date: TOE SURGERY; Right     Comment:  [facility] - [facility]     Reproductive/Obstetrics negative OB ROS                             Anesthesia Physical Anesthesia Plan  ASA: III  Anesthesia Plan: General ETT   Post-op Pain Management:    Induction: Intravenous  PONV Risk Score and Plan: Ondansetron, Dexamethasone, Midazolam and Treatment may vary due to age or medical condition  Airway Management Planned: Oral ETT  Additional Equipment:   Intra-op Plan:   Post-operative Plan: Extubation in OR  Informed Consent: I have reviewed the patients History and Physical, chart, labs and discussed the procedure including the risks, benefits and alternatives for the proposed anesthesia  with the patient or authorized representative who has indicated his/her understanding and acceptance.     Dental Advisory Given  Plan Discussed with: Anesthesiologist, CRNA and Surgeon  Anesthesia Plan Comments: (Patient consented for risks of anesthesia including but not limited to:  - adverse reactions to medications - damage to eyes, teeth, lips or other oral mucosa - nerve damage due to positioning  - sore throat or hoarseness - Damage to heart, brain, nerves, lungs, other parts of body or loss of life  Patient voiced understanding.)        Anesthesia Quick Evaluation

## 2019-12-18 NOTE — Interval H&P Note (Signed)
History and Physical Interval Note:  12/18/2019 7:05 AM  Andrew Watts  has presented today for surgery, with the diagnosis of K80.10 chronic cholecystitis.  The various methods of treatment have been discussed with the patient and family. After consideration of risks, benefits and other options for treatment, the patient has consented to  Procedure(s): XI ROBOTIC ASSISTED LAPAROSCOPIC CHOLECYSTECTOMY (N/A) as a surgical intervention.  The patient's history has been reviewed, patient examined, no change in status, stable for surgery.  I have reviewed the patient's chart and labs.  Questions were answered to the patient's satisfaction.     Carolan Shiver

## 2019-12-19 LAB — SURGICAL PATHOLOGY

## 2019-12-19 NOTE — Anesthesia Postprocedure Evaluation (Signed)
Anesthesia Post Note  Patient: Andrew Watts  Procedure(s) Performed: XI ROBOTIC ASSISTED LAPAROSCOPIC CHOLECYSTECTOMY (N/A Abdomen)  Patient location during evaluation: PACU Anesthesia Type: General Level of consciousness: awake and alert Pain management: pain level controlled Vital Signs Assessment: post-procedure vital signs reviewed and stable Respiratory status: spontaneous breathing, nonlabored ventilation, respiratory function stable and patient connected to nasal cannula oxygen Cardiovascular status: blood pressure returned to baseline and stable Postop Assessment: no apparent nausea or vomiting Anesthetic complications: no   No complications documented.   Last Vitals:  Vitals:   12/18/19 1123 12/18/19 1140  BP: (!) 151/66 (!) 125/59  Pulse: 61 (!) 58  Resp: 18 16  Temp:  (!) 36.1 C  SpO2: 98% 98%    Last Pain:  Vitals:   12/19/19 0804  TempSrc:   PainSc: 2                  Lenard Simmer

## 2021-04-11 IMAGING — CT CT ANGIO CHEST
2 of 6 series · 18 of 46 positions shown · IV contrast (APPLIED)
Comparison: None.

CLINICAL DATA: Shortness of breath.

EXAM:
CT ANGIOGRAPHY CHEST WITH CONTRAST
TECHNIQUE: Multidetector CT imaging of the chest was performed using the
standard protocol during bolus administration of intravenous
contrast. Multiplanar CT image reconstructions and MIPs were
obtained to evaluate the vascular anatomy.
CONTRAST:  60mL OMNIPAQUE IOHEXOL 350 MG/ML SOLN

[Series 5: thins · axial · 0.71mm/px · z∈[-676,-418]mm · 16 of 284 slices shown]
[im 13/284  lung]
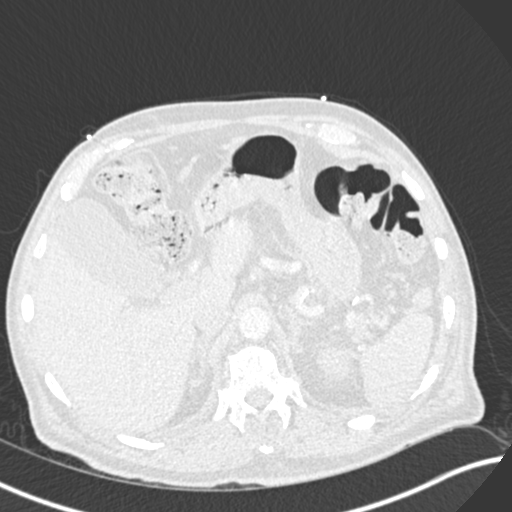
[im 37/284  soft-tissue]
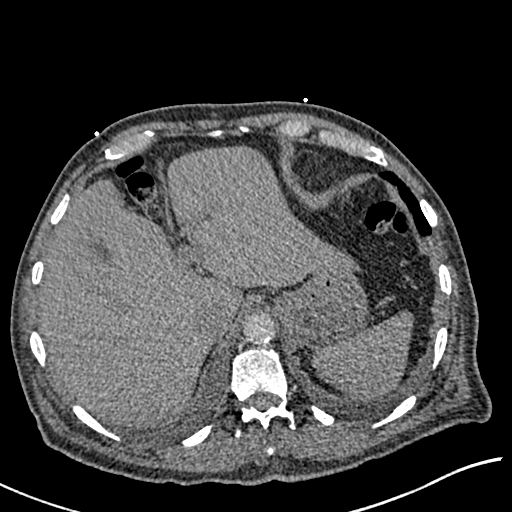
[im 50/284  lung]
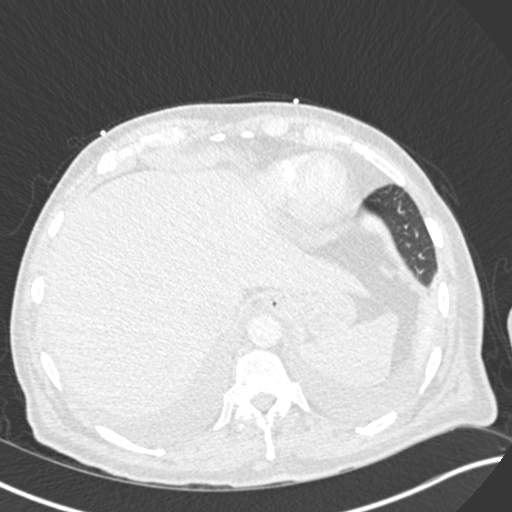
[im 62/284  soft-tissue]
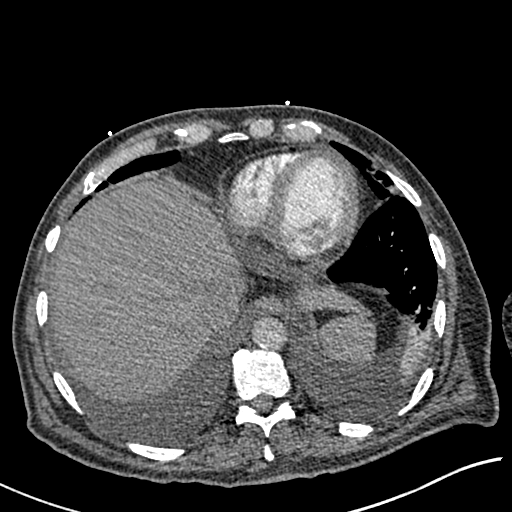
[im 87/284  lung]
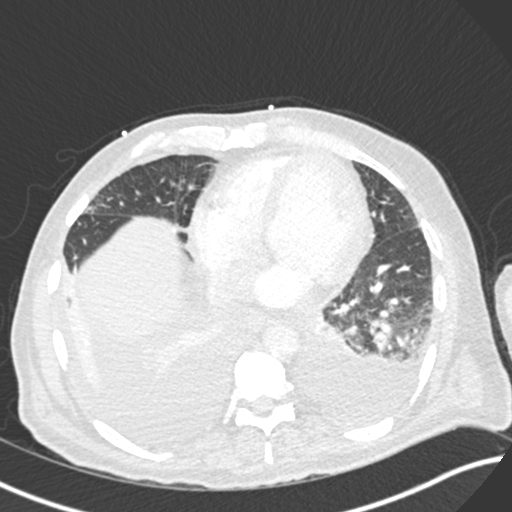
[im 99/284  soft-tissue]
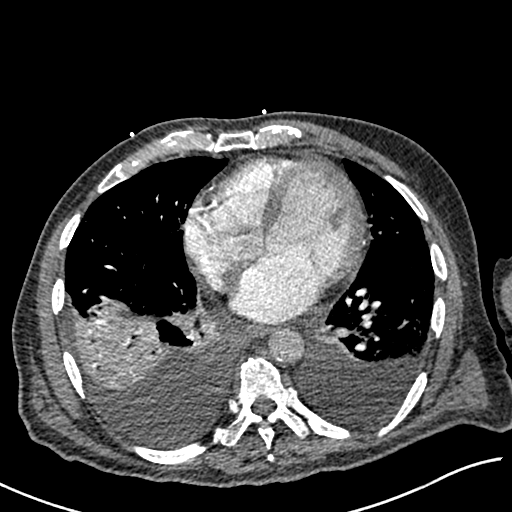
[im 111/284  lung]
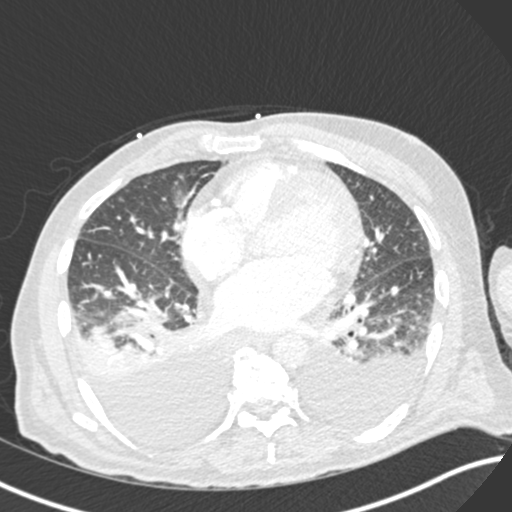
[im 136/284  soft-tissue]
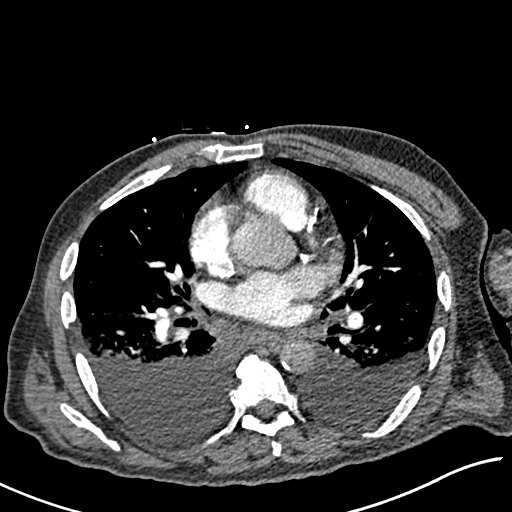
[im 148/284  lung]
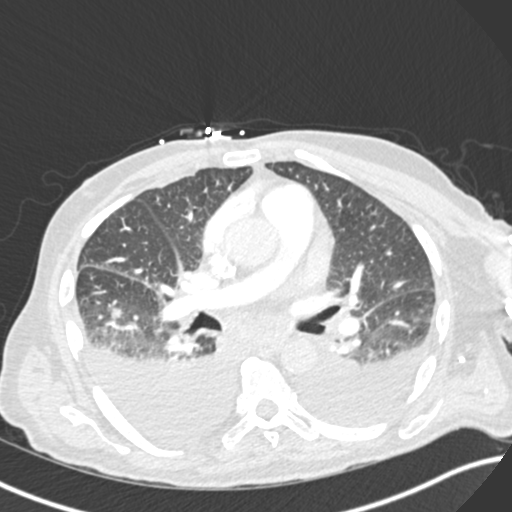
[im 173/284  soft-tissue]
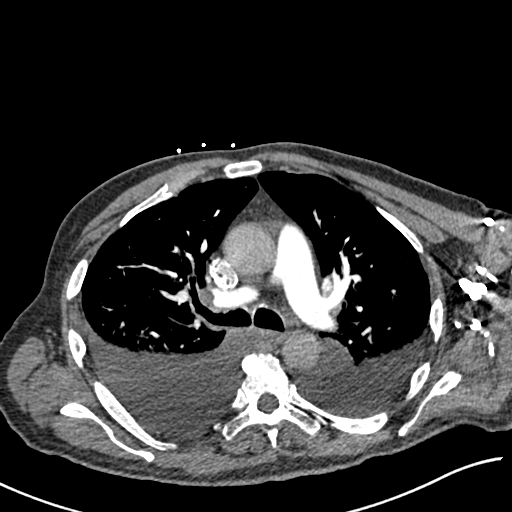
[im 185/284  lung]
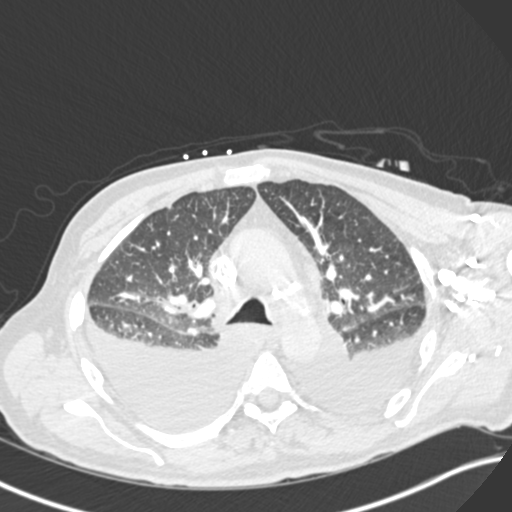
[im 197/284  soft-tissue]
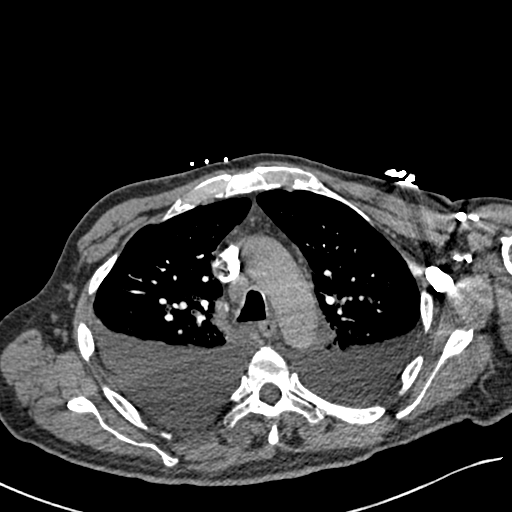
[im 222/284  lung]
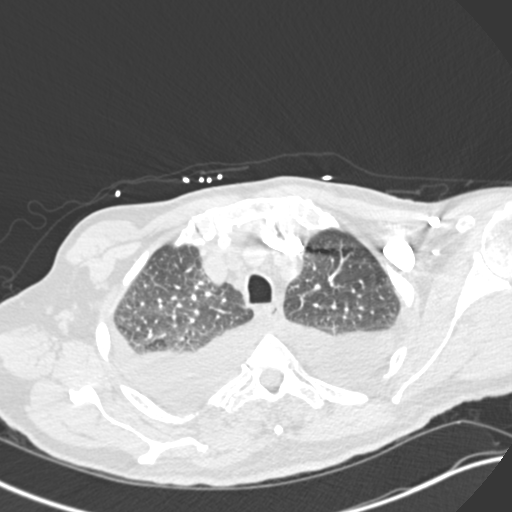
[im 234/284  soft-tissue]
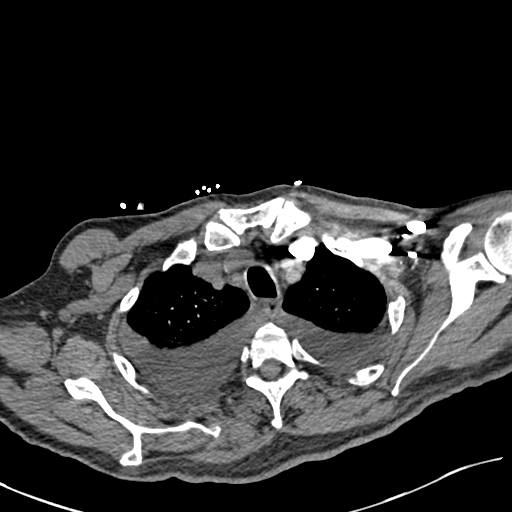
[im 247/284  lung]
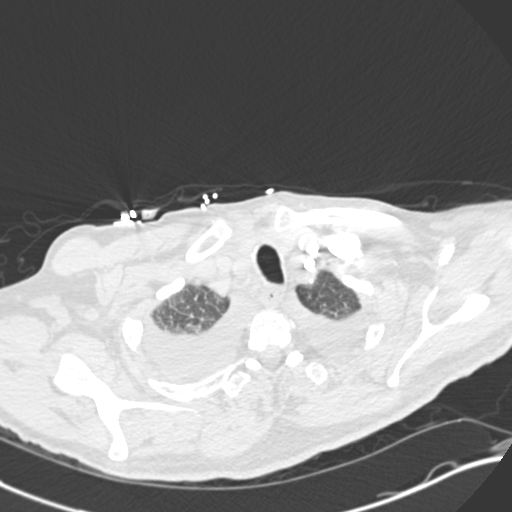
[im 271/284  soft-tissue]
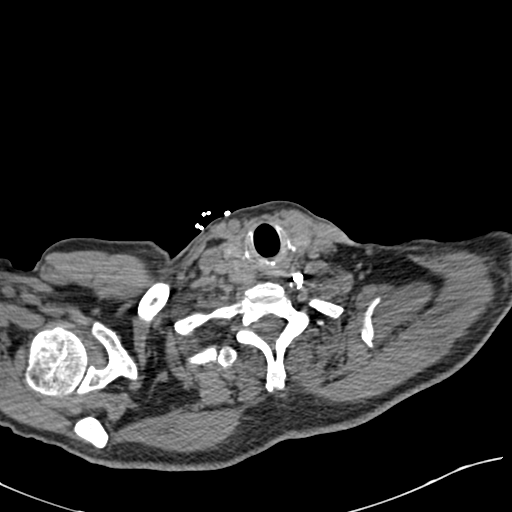

[Series 7: coronal mpr · coronal · 0.55mm/px · 2 of 91 slices shown]
[im 31/91  soft-tissue]
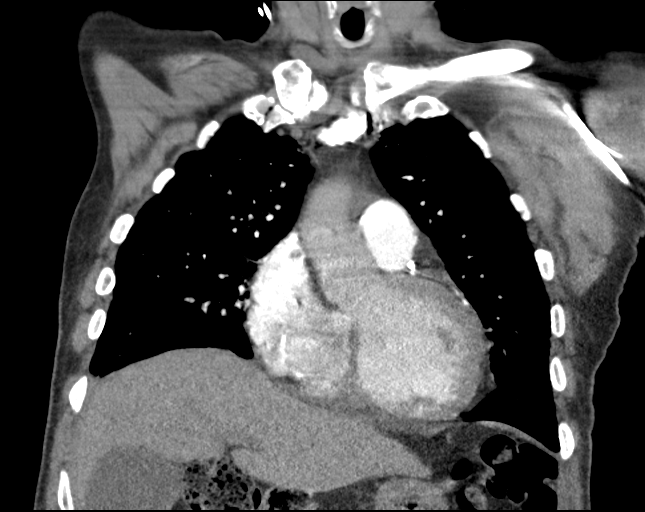
[im 61/91  soft-tissue]
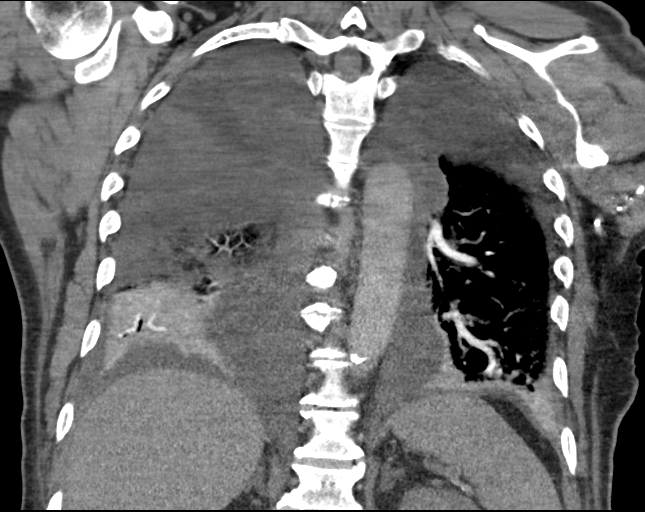

[18 of 46 positions shown; findings below may reference images not displayed]

FINDINGS: Cardiovascular: Contrast injection is sufficient to demonstrate
satisfactory opacification of the pulmonary arteries to the
segmental level. There is no pulmonary embolus. The main pulmonary
artery is within normal limits for size. There is no CT evidence of
acute right heart strain. There are thoracic aortic calcifications
without evidence for an aneurysm. Heart size is enlarged. Coronary
artery calcifications are noted.

Mediastinum/Nodes:

--there is a slightly prominent right paratracheal lymph node
measuring approximately 1.2 cm (axial series 4, image 30).
Additional small subcentimeter mediastinal and hilar lymph nodes are
noted.

--No axillary lymphadenopathy.

--No supraclavicular lymphadenopathy.

--there is a large complex right-sided thyroid nodule measuring
approximately 3.4 x 2.9 cm (axial series 4, image 9).

--The esophagus is unremarkable

Lungs/Pleura: There are large bilateral pleural effusions with
adjacent atelectasis. There is interlobular septal thickening. There
is no pneumothorax. No large focal infiltrate. The trachea is
unremarkable aside from being shifted to the left by the patient's
large right-sided thyroid nodule.

Upper Abdomen: The partially visualized gallbladder is distended
without definite CT evidence for acute cholecystitis.

Musculoskeletal: No chest wall abnormality. No acute or significant
osseous findings.

Review of the MIP images confirms the above findings.
IMPRESSION: 1. No evidence for acute pulmonary embolus.
2. Large bilateral pleural effusions with adjacent atelectasis.
3. Cardiomegaly with interlobular septal thickening, suggestive of
interstitial edema.
4. Mild mediastinal adenopathy, likely reactive.
5. Large complex right-sided thyroid nodule measuring approximately
3.4 cm. No follow-up recommended unless clinically warranted (ref: [HOSPITAL]. [DATE]): 143-50).
6. Aortic Atherosclerosis ([id number]).

## 2021-10-02 IMAGING — US US ABDOMEN LIMITED
1 series · 14 of 25 positions shown · non-contrast
Comparison: None.

CLINICAL DATA: Transaminitis

EXAM:
ULTRASOUND ABDOMEN LIMITED RIGHT UPPER QUADRANT

[Series 1: abdomen us · 14 of 51 slices shown]
[im 1/51]
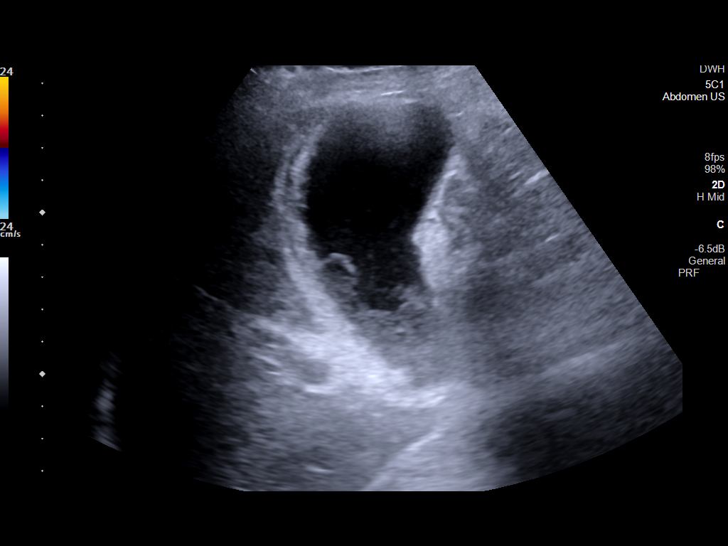
[im 5/51]
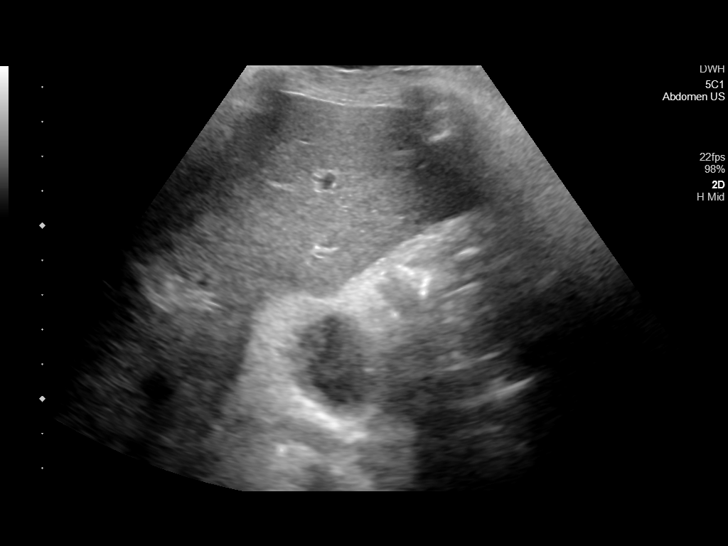
[im 9/51]
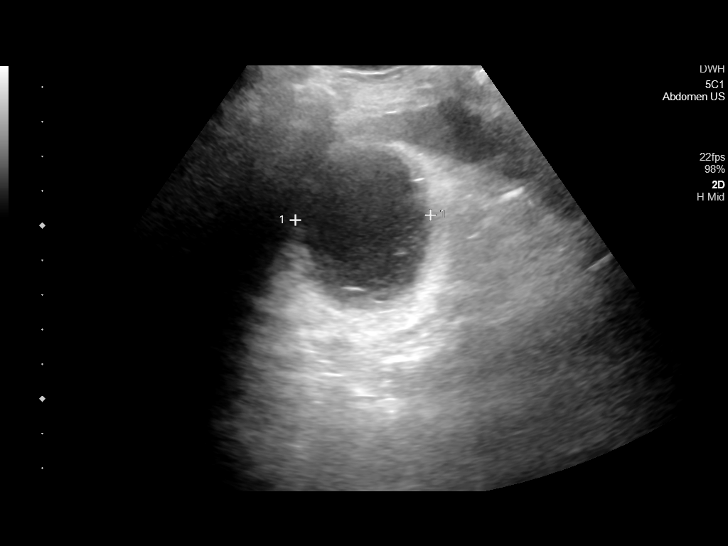
[im 13/51]
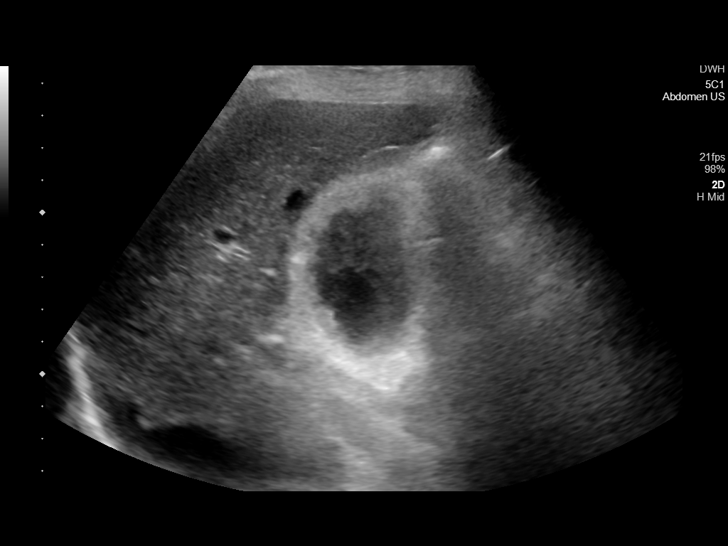
[im 17/51]
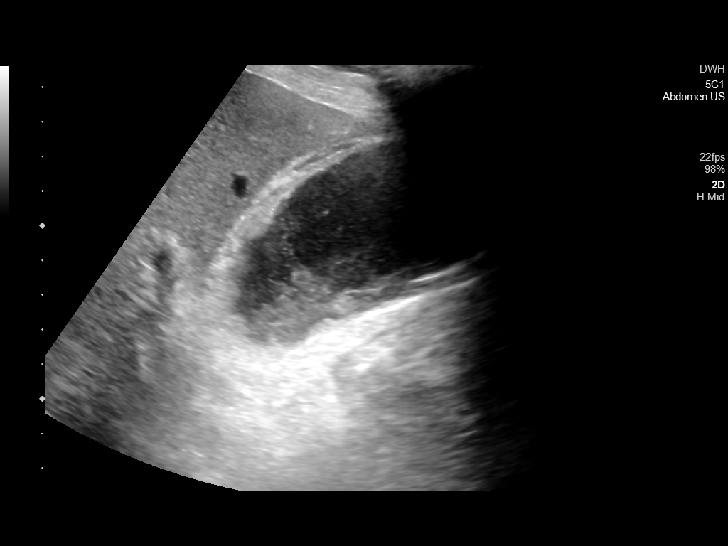
[im 19/51]
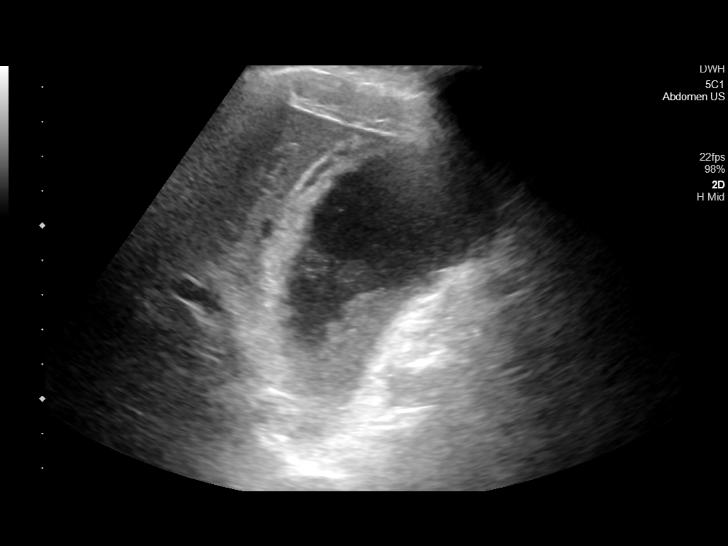
[im 23/51]
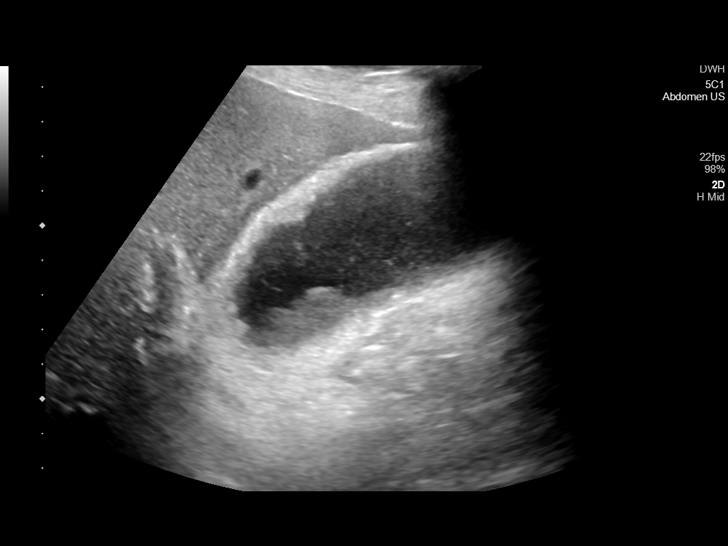
[im 28/51]
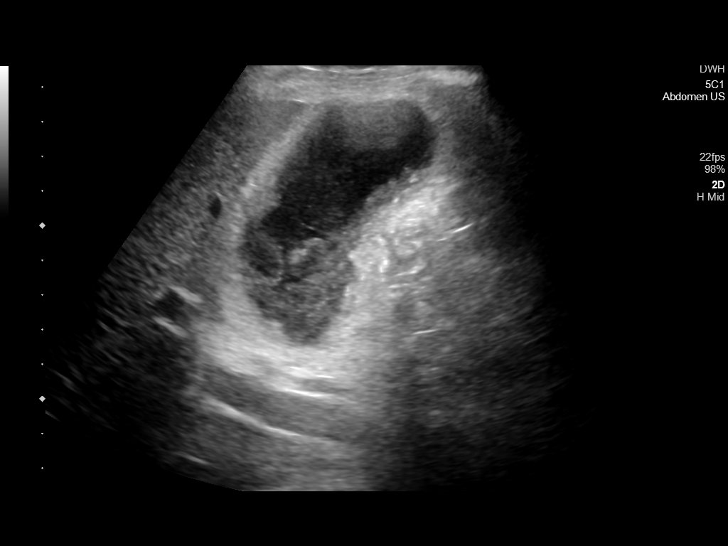
[im 32/51]
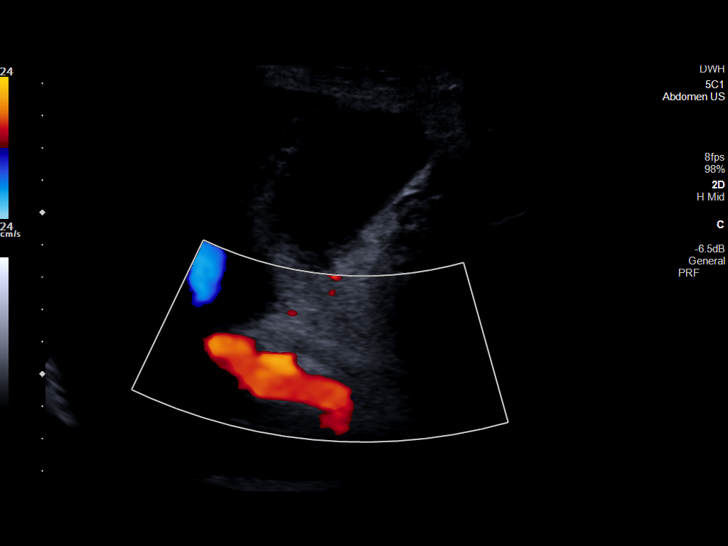
[im 34/51]
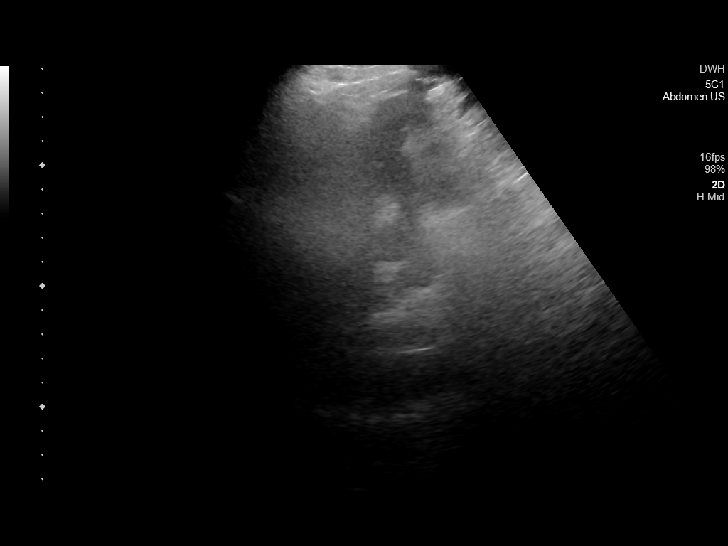
[im 38/51]
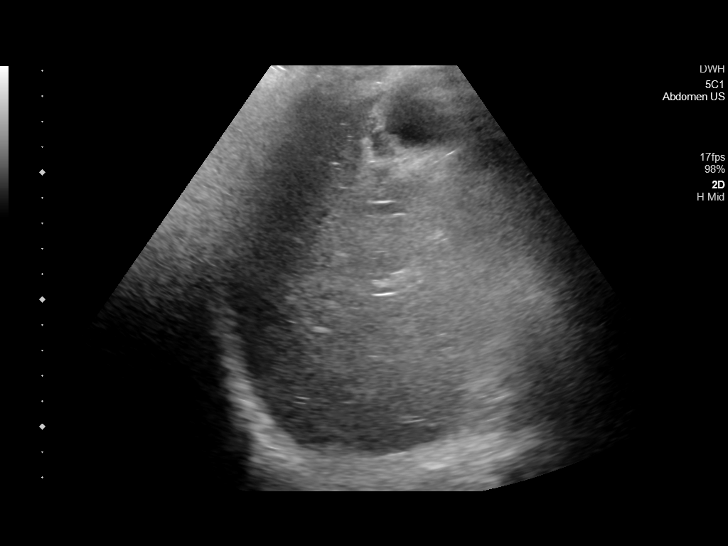
[im 42/51]
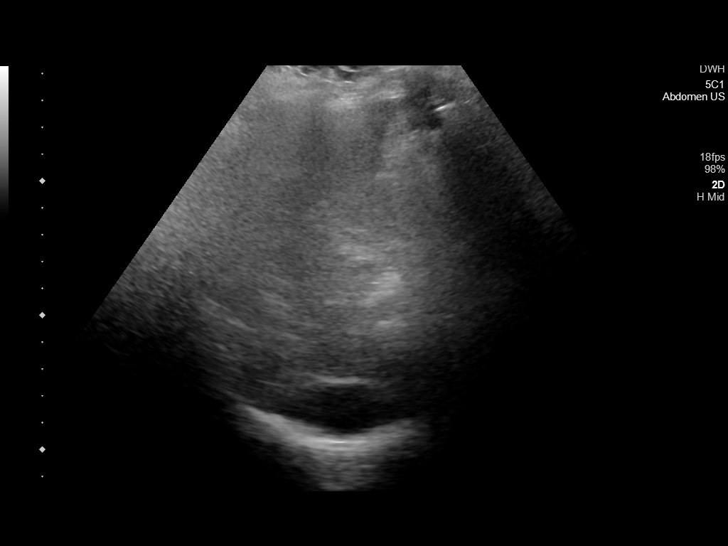
[im 46/51]
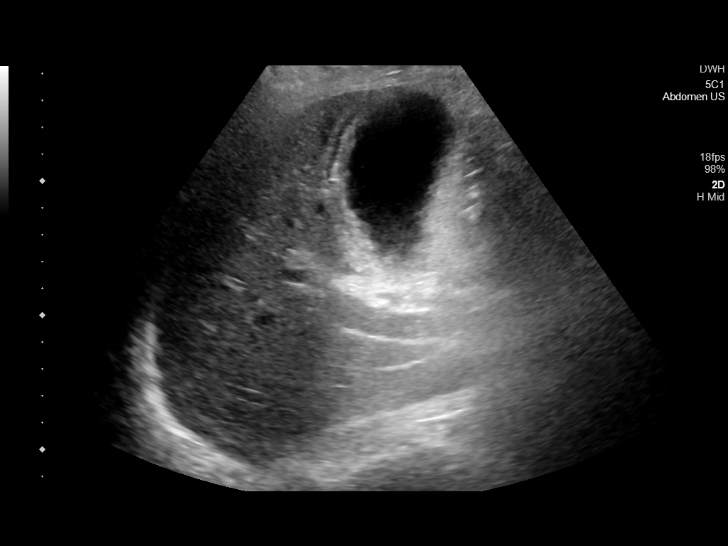
[im 51/51]
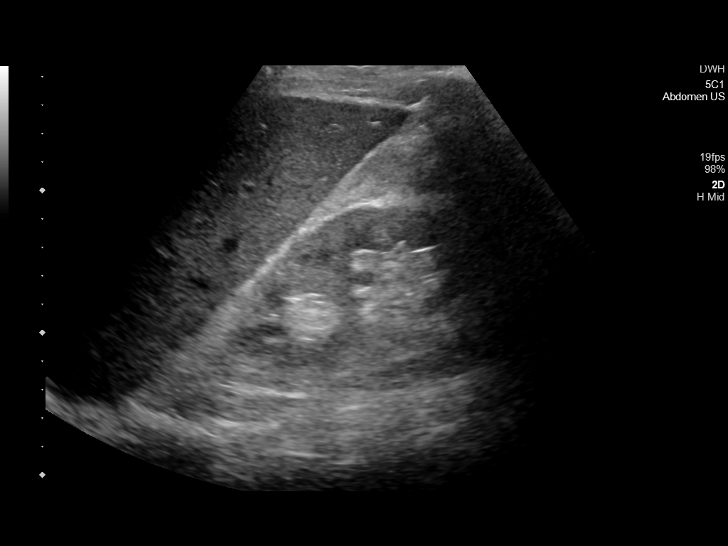

[14 of 25 positions shown; findings below may reference images not displayed]

FINDINGS: Gallbladder:

Thickened gallbladder wall is seen measuring up to 8.5 mm. Layering
small stones or sludge is seen. The largest calculi measures 6 mm.
No sonographic Murphy sign is seen. Possible trace pericholecystic
fluid.

Common bile duct:

Diameter: 5 mm

Liver:

No focal lesion identified. Within normal limits in parenchymal
echogenicity. Portal vein is patent on color Doppler imaging with
normal direction of blood flow towards the liver.

Other: None.
IMPRESSION: Findings which could be suggestive of acute cholecystitis
